# Patient Record
Sex: Female | Born: 1945 | Race: Black or African American | Hispanic: No | Smoking: Current every day smoker
Health system: Southern US, Community
[De-identification: ages and names within clinical notes are randomized; demographics above are authoritative.]

## PROBLEM LIST (undated history)

## (undated) DIAGNOSIS — G4700 Insomnia, unspecified: Secondary | ICD-10-CM

## (undated) DIAGNOSIS — R232 Flushing: Secondary | ICD-10-CM

## (undated) DIAGNOSIS — I7090 Unspecified atherosclerosis: Secondary | ICD-10-CM

## (undated) DIAGNOSIS — Z87898 Personal history of other specified conditions: Secondary | ICD-10-CM

## (undated) DIAGNOSIS — N898 Other specified noninflammatory disorders of vagina: Secondary | ICD-10-CM

## (undated) DIAGNOSIS — R911 Solitary pulmonary nodule: Secondary | ICD-10-CM

## (undated) DIAGNOSIS — D649 Anemia, unspecified: Secondary | ICD-10-CM

## (undated) DIAGNOSIS — K219 Gastro-esophageal reflux disease without esophagitis: Secondary | ICD-10-CM

## (undated) DIAGNOSIS — J189 Pneumonia, unspecified organism: Secondary | ICD-10-CM

## (undated) DIAGNOSIS — Z720 Tobacco use: Secondary | ICD-10-CM

## (undated) DIAGNOSIS — Z8739 Personal history of other diseases of the musculoskeletal system and connective tissue: Secondary | ICD-10-CM

## (undated) DIAGNOSIS — F17200 Nicotine dependence, unspecified, uncomplicated: Secondary | ICD-10-CM

## (undated) DIAGNOSIS — E7800 Pure hypercholesterolemia, unspecified: Secondary | ICD-10-CM

## (undated) DIAGNOSIS — J449 Chronic obstructive pulmonary disease, unspecified: Secondary | ICD-10-CM

## (undated) DIAGNOSIS — I10 Essential (primary) hypertension: Secondary | ICD-10-CM

## (undated) DIAGNOSIS — K862 Cyst of pancreas: Secondary | ICD-10-CM

## (undated) DIAGNOSIS — M8580 Other specified disorders of bone density and structure, unspecified site: Secondary | ICD-10-CM

## (undated) DIAGNOSIS — IMO0002 Reserved for concepts with insufficient information to code with codable children: Secondary | ICD-10-CM

## (undated) DIAGNOSIS — K8020 Calculus of gallbladder without cholecystitis without obstruction: Secondary | ICD-10-CM

## (undated) DIAGNOSIS — I509 Heart failure, unspecified: Secondary | ICD-10-CM

## (undated) DIAGNOSIS — I208 Other forms of angina pectoris: Secondary | ICD-10-CM

## (undated) DIAGNOSIS — I2089 Other forms of angina pectoris: Secondary | ICD-10-CM

## (undated) DIAGNOSIS — I2510 Atherosclerotic heart disease of native coronary artery without angina pectoris: Secondary | ICD-10-CM

## (undated) DIAGNOSIS — I2584 Coronary atherosclerosis due to calcified coronary lesion: Secondary | ICD-10-CM

## (undated) HISTORY — DX: Other specified noninflammatory disorders of vagina: N89.8

## (undated) HISTORY — DX: Insomnia, unspecified: G47.00

## (undated) HISTORY — DX: Nicotine dependence, unspecified, uncomplicated: F17.200

## (undated) HISTORY — DX: Calculus of gallbladder without cholecystitis without obstruction: K80.20

## (undated) HISTORY — DX: Pure hypercholesterolemia, unspecified: E78.00

## (undated) HISTORY — DX: Chronic obstructive pulmonary disease, unspecified: J44.9

## (undated) HISTORY — DX: Other forms of angina pectoris: I20.8

## (undated) HISTORY — DX: Other specified disorders of bone density and structure, unspecified site: M85.80

## (undated) HISTORY — DX: Reserved for concepts with insufficient information to code with codable children: IMO0002

## (undated) HISTORY — DX: Unspecified atherosclerosis: I70.90

## (undated) HISTORY — DX: Heart failure, unspecified: I50.9

## (undated) HISTORY — DX: Anemia, unspecified: D64.9

## (undated) HISTORY — DX: Solitary pulmonary nodule: R91.1

## (undated) HISTORY — DX: Coronary atherosclerosis due to calcified coronary lesion: I25.84

## (undated) HISTORY — DX: Atherosclerotic heart disease of native coronary artery without angina pectoris: I25.10

## (undated) HISTORY — DX: Gastro-esophageal reflux disease without esophagitis: K21.9

## (undated) HISTORY — DX: Cyst of pancreas: K86.2

## (undated) HISTORY — PX: CHOLECYSTECTOMY: SHX55

## (undated) HISTORY — DX: Personal history of other specified conditions: Z87.898

## (undated) HISTORY — DX: Tobacco use: Z72.0

## (undated) HISTORY — DX: Personal history of other diseases of the musculoskeletal system and connective tissue: Z87.39

## (undated) HISTORY — DX: Pneumonia, unspecified organism: J18.9

## (undated) HISTORY — DX: Flushing: R23.2

## (undated) HISTORY — DX: Other forms of angina pectoris: I20.89

## (undated) HISTORY — DX: Essential (primary) hypertension: I10

---

## 2000-06-04 ENCOUNTER — Emergency Department (HOSPITAL_COMMUNITY): Admission: EM | Admit: 2000-06-04 | Discharge: 2000-06-05 | Payer: Self-pay | Admitting: Emergency Medicine

## 2000-06-04 ENCOUNTER — Encounter: Payer: Self-pay | Admitting: Emergency Medicine

## 2005-08-31 ENCOUNTER — Ambulatory Visit: Payer: Self-pay | Admitting: Nurse Practitioner

## 2005-09-01 ENCOUNTER — Ambulatory Visit: Payer: Self-pay | Admitting: *Deleted

## 2005-09-03 ENCOUNTER — Ambulatory Visit (HOSPITAL_COMMUNITY): Admission: RE | Admit: 2005-09-03 | Discharge: 2005-09-03 | Payer: Self-pay | Admitting: Family Medicine

## 2005-09-29 ENCOUNTER — Ambulatory Visit (HOSPITAL_COMMUNITY): Admission: RE | Admit: 2005-09-29 | Discharge: 2005-09-29 | Payer: Self-pay | Admitting: Internal Medicine

## 2005-10-22 ENCOUNTER — Ambulatory Visit: Payer: Self-pay | Admitting: Nurse Practitioner

## 2006-04-13 ENCOUNTER — Ambulatory Visit: Payer: Self-pay | Admitting: Nurse Practitioner

## 2006-05-10 ENCOUNTER — Emergency Department (HOSPITAL_COMMUNITY): Admission: EM | Admit: 2006-05-10 | Discharge: 2006-05-10 | Payer: Self-pay | Admitting: Emergency Medicine

## 2006-05-11 ENCOUNTER — Ambulatory Visit: Payer: Self-pay | Admitting: Nurse Practitioner

## 2006-06-02 ENCOUNTER — Ambulatory Visit (HOSPITAL_COMMUNITY): Admission: RE | Admit: 2006-06-02 | Discharge: 2006-06-02 | Payer: Self-pay | Admitting: Nurse Practitioner

## 2006-06-02 ENCOUNTER — Ambulatory Visit: Payer: Self-pay | Admitting: Nurse Practitioner

## 2006-09-28 ENCOUNTER — Encounter (INDEPENDENT_AMBULATORY_CARE_PROVIDER_SITE_OTHER): Payer: Self-pay | Admitting: *Deleted

## 2006-09-28 ENCOUNTER — Encounter
Admission: RE | Admit: 2006-09-28 | Discharge: 2006-09-28 | Payer: Self-pay | Admitting: Physical Medicine and Rehabilitation

## 2006-10-03 ENCOUNTER — Ambulatory Visit (HOSPITAL_COMMUNITY): Admission: RE | Admit: 2006-10-03 | Discharge: 2006-10-03 | Payer: Self-pay | Admitting: Family Medicine

## 2006-11-27 ENCOUNTER — Emergency Department (HOSPITAL_COMMUNITY): Admission: EM | Admit: 2006-11-27 | Discharge: 2006-11-28 | Payer: Self-pay | Admitting: Emergency Medicine

## 2006-12-28 ENCOUNTER — Encounter: Admission: RE | Admit: 2006-12-28 | Discharge: 2006-12-28 | Payer: Self-pay | Admitting: Gastroenterology

## 2007-01-02 ENCOUNTER — Emergency Department (HOSPITAL_COMMUNITY): Admission: EM | Admit: 2007-01-02 | Discharge: 2007-01-02 | Payer: Self-pay | Admitting: Emergency Medicine

## 2007-01-08 ENCOUNTER — Encounter: Admission: RE | Admit: 2007-01-08 | Discharge: 2007-01-08 | Payer: Self-pay | Admitting: General Surgery

## 2007-03-09 ENCOUNTER — Ambulatory Visit (HOSPITAL_COMMUNITY): Admission: RE | Admit: 2007-03-09 | Discharge: 2007-03-10 | Payer: Self-pay | Admitting: General Surgery

## 2007-03-09 ENCOUNTER — Encounter (INDEPENDENT_AMBULATORY_CARE_PROVIDER_SITE_OTHER): Payer: Self-pay | Admitting: General Surgery

## 2007-10-04 ENCOUNTER — Ambulatory Visit (HOSPITAL_COMMUNITY): Admission: RE | Admit: 2007-10-04 | Discharge: 2007-10-04 | Payer: Self-pay | Admitting: Family Medicine

## 2008-08-15 ENCOUNTER — Encounter: Admission: RE | Admit: 2008-08-15 | Discharge: 2008-08-15 | Payer: Self-pay | Admitting: Family Medicine

## 2008-08-22 ENCOUNTER — Ambulatory Visit (HOSPITAL_COMMUNITY): Admission: RE | Admit: 2008-08-22 | Discharge: 2008-08-22 | Payer: Self-pay | Admitting: Family Medicine

## 2008-10-04 ENCOUNTER — Ambulatory Visit (HOSPITAL_COMMUNITY): Admission: RE | Admit: 2008-10-04 | Discharge: 2008-10-04 | Payer: Self-pay | Admitting: Family Medicine

## 2008-10-15 ENCOUNTER — Other Ambulatory Visit: Admission: RE | Admit: 2008-10-15 | Discharge: 2008-10-15 | Payer: Self-pay | Admitting: Family Medicine

## 2010-02-01 ENCOUNTER — Encounter: Payer: Self-pay | Admitting: Family Medicine

## 2010-02-23 ENCOUNTER — Ambulatory Visit (INDEPENDENT_AMBULATORY_CARE_PROVIDER_SITE_OTHER): Payer: Medicare Other | Admitting: Cardiovascular Disease

## 2010-02-23 DIAGNOSIS — R079 Chest pain, unspecified: Secondary | ICD-10-CM

## 2010-02-23 DIAGNOSIS — E7800 Pure hypercholesterolemia, unspecified: Secondary | ICD-10-CM

## 2010-03-06 ENCOUNTER — Telehealth (INDEPENDENT_AMBULATORY_CARE_PROVIDER_SITE_OTHER): Payer: Self-pay | Admitting: *Deleted

## 2010-03-09 ENCOUNTER — Encounter: Payer: Self-pay | Admitting: Internal Medicine

## 2010-03-09 ENCOUNTER — Ambulatory Visit (HOSPITAL_COMMUNITY): Payer: Medicare Other | Attending: Cardiovascular Disease

## 2010-03-09 DIAGNOSIS — R079 Chest pain, unspecified: Secondary | ICD-10-CM | POA: Insufficient documentation

## 2010-03-09 DIAGNOSIS — R0602 Shortness of breath: Secondary | ICD-10-CM

## 2010-03-09 DIAGNOSIS — R0789 Other chest pain: Secondary | ICD-10-CM

## 2010-03-10 NOTE — Progress Notes (Signed)
Summary: Nuclear Pre-Procedure  Phone Note Outgoing Call Call back at [facility] Phone [phone]   Call placed by: Stanton Kidney, EMT-P,  March 06, 2010 1:04 PM Call placed to: Patient Action Taken: Phone Call Completed Summary of Call: Reviewed information on Myoview Information Sheet (see scanned document for further details).  Spoke with the patient. Stanton Kidney, EMT-P  March 06, 2010 1:04 PM      Nuclear Med Background Indications for Stress Test: Evaluation for Ischemia   History: Myocardial Perfusion Study  History Comments: '00 MPS: NL  Symptoms: Chest Pain    Nuclear Pre-Procedure Cardiac Risk Factors: Lipids, Smoker

## 2010-03-19 NOTE — Assessment & Plan Note (Signed)
Summary: Cardiology Nuclear Testing  Nuclear Med Background Indications for Stress Test: Evaluation for Ischemia   History: Myocardial Perfusion Study  History Comments: '00 MPS: NL  Symptoms: Chest Pain, Chest Tightness, Diaphoresis, Fatigue, Light-Headedness, Nausea, Palpitations  Symptoms Comments: Last episode of CP and tightness was 2wks ago.  Pain radiates to L side/neck.   Nuclear Pre-Procedure Cardiac Risk Factors: Family History - CAD, Lipids, Smoker Caffeine/Decaff Intake: None NPO After: 6:00 PM Lungs: clear IV 0.9% NS with Angio Cath: 18g     IV Site: R Antecubital IV Started by: Stanton Kidney, EMT-P Chest Size (in) 40     Cup Size B     Height (in): 61.5 Weight (lb): 147 BMI: 27.42  Nuclear Med Study 1 or 2 day study:  1 day     Stress Test Type:  Eugenie Birks Reading MD:  Dietrich Pates, MD     Referring MD:  Gildardo Cranker Resting Radionuclide:  Technetium 34m Tetrofosmin     Resting Radionuclide Dose:  11.0 mCi  Stress Radionuclide:  Technetium 5m Tetrofosmin     Stress Radionuclide Dose:  33.0 mCi   Stress Protocol      Max HR:  83 bpm     Predicted Max HR:  154 bpm  Max Systolic BP: 131 mm Hg     Percent Max HR:  53.90 %Rate Pressure Product:  73220  Lexiscan: 0.4 mg   Stress Test Technologist:  Cathlyn Parsons, RN     Nuclear Technologist:  Domenic Polite, CNMT  Rest Procedure  Myocardial perfusion imaging was performed at rest 45 minutes following the intravenous administration of Technetium 39m Tetrofosmin.  Stress Procedure  The patient received IV Lexiscan 0.4 mg over 15-seconds.  Technetium 43m Tetrofosmin injected at 30-seconds.  There were no significant changes with infusion. Patient had chest tightness 6/10 with infusion.  Relilieved in recovery. Patient had occas PAC's.  Quantitative spect images were obtained after a 45 minute delay.  QPS Raw Data Images:  Images were motion corrected.  SOft tissue (diaphragm, bowel activity, breast tissue)  surround heart. Stress Images:  Thinning in the distal anterior and apical walls.  Otherwise normal perfusion. Rest Images:  Incomplete improvement from the stress images. Subtraction (SDS):  No significant ischemia Transient Ischemic Dilatation:  1.05  (Normal <1.22)  Lung/Heart Ratio:  0.28  (Normal <0.45)  Quantitative Gated Spect Images QGS EDV:  60 ml QGS ESV:  16 ml QGS EF:  73 % QGS cine images:  Normal wall motion.   Overall Impression  Exercise Capacity: Lexiscan with no exercise. BP Response: Normal blood pressure response. Clinical Symptoms: Moderate chest tightness, eased after infusion. ECG Impression: No significant ST segment change suggestive of ischemia. Overall Impression: Probable normal perfusion and soft tissue attenuation (breast).  No significant ischemia or scar.  Appended Document: Cardiology Nuclear Testing copy sent to Dr. Melburn Popper

## 2010-05-26 NOTE — Op Note (Signed)
NAME:  Autumn Mckinney, Autumn Mckinney NO.:  [id number]   MEDICAL RECORD NO.:  [id number]          PATIENT TYPE:  OIB   LOCATION:  5122                         FACILITY:  [facility]   PHYSICIAN:  Ollen Gross. Vernell Morgans, M.D. DATE OF BIRTH:  Oct 09, 1945   DATE OF PROCEDURE:  03/09/2007  DATE OF DISCHARGE:  03/10/2007                               OPERATIVE REPORT   PREOPERATIVE DIAGNOSIS:  Gallstones.   POSTOPERATIVE DIAGNOSIS:  Gallstones.   PROCEDURE:  Laparoscopic cholecystectomy with intraoperative  cholangiogram.   SURGEON:  Ollen Gross. Vernell Morgans, M.D.   ASSISTANT:  Leonie Man, M.D.   ANESTHESIA:  General endotracheal.   PROCEDURE:  After informed consent was obtained, the patient was brought  to the operating room and placed in the supine position on the operating  room table.  After induction of general anesthesia, the patient's  abdomen was prepped with Betadine and draped in the usual sterile  manner.  The area below the umbilicus was infiltrated with 0.25%  Marcaine.  A small incision was made with a 15-blade knife.  This  incision was carried down through the subcutaneous tissue bluntly with a  hemostat and [employer] retractors until the linea alba was identified.  The linea alba was incised with a 15-blade knife and each side was  grasped with Kocher clamps and elevated anteriorly.  The preperitoneal  space was then probed bluntly with a hemostat until the peritoneum was  opened and access was gained to the abdominal cavity.  A 0 Vicryl  pursestring stitch was placed in the fascia around the opening, an  Hasson cannula was placed through the opening and anchored in place with  the previously placed Vicryl pursestring stitch.  The abdomen was then  insufflated with carbon dioxide without difficulty.  The patient was  placed in reverse Trendelenburg position and rotated with the right side  up.  A laparoscope was inserted through the Hasson cannula and the right  upper  quadrant was inspected.  The dome of the gallbladder and liver  were readily identified.  Next the epigastric region was infiltrated  with 0.25% Marcaine.  A small incision was made with a 15- blade knife.  A 10 mm port was then placed bluntly through this incision into the  abdominal cavity under direct vision.  Sites were then chosen laterally  on the right side of the abdomen with the placement of 5 mm ports.  Each  of these areas was infiltrated with 0.25% Marcaine.  Small stab  incisions were made with the 15-blade knife.  Five millimeter ports were  then placed bluntly through these incisions into the abdominal cavity  under direct vision.  A blunt grasper was placed through the lateralmost  5 mm port and used to grasp the dome of the gallbladder and elevated  anteriorly and superiorly.  Another blunt grasper was placed through the  other 5 mm port and used to retract on the body and neck of the  gallbladder.  Dissector was placed through the epigastric port and using  the electrocautery the peritoneal reflection at the gallbladder neck  area was opened.  Blunt dissection was then carried out in this area  until the gallbladder neck/cystic duct junction was readily identified  and a good window was created.  A single clip was placed on the  gallbladder neck.  A small ductotomy was made just below the clip.  A 14-  gauge Angiocath was then placed percutaneously through the anterior  abdominal wall under direct vision.  A Reddick cholangiogram catheter  placed through the Angiocath and flushed.  The Reddick catheter was then  placed within the cystic duct and anchored in place with a clip.  Cholangiogram was obtained that showed no filling defects, good emptying  in the duodenum, and a very short cystic duct.  The anchoring clip and  catheters were removed from the patient.  Two clips were placed proximal  on the cystic duct and the duct was divided between the 2 sets of clips.   Posterior to this the cystic artery was identified and again dissected  bluntly in a circumferential manner until a good window was created.  Two clips were placed proximally and one distally on the artery and the  artery was divided between the two.  Next a laparoscopic hook cautery  device was used to separate the gallbladder from the liver bed.  Prior  to completely detaching the gallbladder from the liver bed, the liver  bed was inspected and several small bleeding points were coagulated with  electrocautery until the area was completely hemostatic.  The  gallbladder was then detached from its source from the liver bed without  difficulty.  A laparoscopic bag was then inserted through the epigastric  port, the gallbladder was placed in the bag and bag was sealed.  The  abdomen was then irrigated with copious amounts of saline until the  effluent was clear.  The laparoscope was then moved to the epigastric  port.  A gallbladder grasper was placed through the Hasson cannula and  used to grasp the open end of the bag.  The bag with the gallbladder was  removed through the infraumbilical port without difficulty.  The fascial  defect was closed with the previously placed Vicryl pursestring stitch  as well as with another figure-of-eight 0 Vicryl stitch.  The rest of  the ports were removed under direct vision and were found to be  hemostatic.  The gas was allowed to escape.  The skin incisions were all  closed with interrupted 4-0 Monocryl subcuticular stitches.  Dermabond  dressings were applied.  The patient tolerated the procedure well.  At  the end of the case all needle, sponge and instrument counts were  correct.  The patient was then awakened and taken to the recovery room  in stable condition.      Ollen Gross. Vernell Morgans, M.D.  Electronically Signed     PST/MEDQ  D:  03/09/2007  T:  03/10/2007  Job:  [id number]

## 2010-10-02 LAB — COMPREHENSIVE METABOLIC PANEL
ALT: 11
Calcium: 9.2
Chloride: 107
Creatinine, Ser: 0.71
GFR calc non Af Amer: >60
Sodium: 138
Total Bilirubin: 0.4
Total Protein: 7.5

## 2010-10-02 LAB — DIFFERENTIAL
Eosinophils Relative: 1
Lymphocytes Relative: 39
Lymphs Abs: 2.3
Monocytes Absolute: 0.5

## 2010-10-02 LAB — CBC
Hemoglobin: 14.3
MCV: 90.4
Platelets: 307
RDW: 13.9

## 2010-10-16 LAB — CBC
HCT: 41.6
Hemoglobin: 14.4
MCHC: 34.6
MCV: 87.6
RBC: 4.75
RDW: 14.8
WBC: 6.7

## 2010-10-16 LAB — DIFFERENTIAL
Basophils Absolute: 0
Basophils Relative: 0
Neutro Abs: 3.9

## 2010-10-16 LAB — COMPREHENSIVE METABOLIC PANEL
AST: 19
Alkaline Phosphatase: 87
BUN: 7
CO2: 24
Calcium: 9.4
Glucose, Bld: 150 — ABNORMAL HIGH
Potassium: 3.8
Sodium: 138
Total Protein: 7.7

## 2010-10-16 LAB — URINALYSIS, ROUTINE W REFLEX MICROSCOPIC
Bilirubin Urine: NEGATIVE
Ketones, ur: NEGATIVE
Nitrite: NEGATIVE
Specific Gravity, Urine: 1.02

## 2010-10-20 LAB — CBC
MCHC: 34.6
MCV: 87.5
Platelets: 274
RDW: 14.5

## 2010-10-20 LAB — COMPREHENSIVE METABOLIC PANEL
AST: 17
Albumin: 3.5
BUN: 6
Calcium: 9
Creatinine, Ser: 0.63
GFR calc Af Amer: >60

## 2010-10-20 LAB — URINALYSIS, ROUTINE W REFLEX MICROSCOPIC
Bilirubin Urine: NEGATIVE
Hgb urine dipstick: NEGATIVE
Ketones, ur: NEGATIVE
Protein, ur: NEGATIVE
Urobilinogen, UA: 0.2

## 2010-10-20 LAB — DIFFERENTIAL
Eosinophils Relative: 2
Lymphocytes Relative: 45
Lymphs Abs: 3.2
Monocytes Absolute: 0.6
Neutro Abs: 3.2

## 2010-10-20 LAB — LIPASE, BLOOD: Lipase: 24

## 2010-10-22 ENCOUNTER — Other Ambulatory Visit (HOSPITAL_COMMUNITY): Payer: Self-pay | Admitting: Family Medicine

## 2010-10-22 DIAGNOSIS — Z1231 Encounter for screening mammogram for malignant neoplasm of breast: Secondary | ICD-10-CM

## 2010-11-05 ENCOUNTER — Ambulatory Visit (HOSPITAL_COMMUNITY)
Admission: RE | Admit: 2010-11-05 | Discharge: 2010-11-05 | Disposition: A | Discharge status: Home / Self Care | Payer: Medicare Other | Source: Ambulatory Visit | Attending: Family Medicine | Admitting: Family Medicine

## 2010-11-05 DIAGNOSIS — Z1231 Encounter for screening mammogram for malignant neoplasm of breast: Secondary | ICD-10-CM | POA: Insufficient documentation

## 2011-02-14 ENCOUNTER — Other Ambulatory Visit: Payer: Self-pay | Admitting: Cardiovascular Disease

## 2011-02-15 NOTE — Telephone Encounter (Signed)
Fax Received. Refill Completed. Autumn Mckinney (R.M.A)  Pt needs appointment then refill can be made  

## 2011-03-10 ENCOUNTER — Other Ambulatory Visit: Payer: Self-pay | Admitting: Cardiovascular Disease

## 2011-03-11 ENCOUNTER — Telehealth: Payer: Self-pay | Admitting: Cardiovascular Disease

## 2011-03-11 MED ORDER — AMLODIPINE BESYLATE 10 MG PO TABS: 10.0000 mg | ORAL_TABLET | Freq: Every day | ORAL | Status: DC

## 2011-03-11 NOTE — Telephone Encounter (Signed)
Called  Pt informed we need to have yearly app, told her we will refill for 60  Days, pt agreed to plan.

## 2011-03-11 NOTE — Telephone Encounter (Signed)
   Patient would like a return call concerning meds.  She attempted to get a refill on amlodipine med and was told she needed to see the doctor.  Patient said she will not be able to come in as she is on the way out of town to take care of daughter who is having surgery.  Please call her at [phone] to advise

## 2011-05-17 ENCOUNTER — Other Ambulatory Visit: Payer: Self-pay | Admitting: Cardiovascular Disease

## 2012-06-26 ENCOUNTER — Other Ambulatory Visit (HOSPITAL_COMMUNITY): Payer: Self-pay | Admitting: *Deleted

## 2012-06-26 DIAGNOSIS — Z1231 Encounter for screening mammogram for malignant neoplasm of breast: Secondary | ICD-10-CM

## 2012-07-06 ENCOUNTER — Ambulatory Visit (HOSPITAL_COMMUNITY)
Admission: RE | Admit: 2012-07-06 | Discharge: 2012-07-06 | Disposition: A | Discharge status: Home / Self Care | Payer: Medicare Other | Source: Ambulatory Visit | Attending: *Deleted | Admitting: *Deleted

## 2012-07-06 DIAGNOSIS — Z1231 Encounter for screening mammogram for malignant neoplasm of breast: Secondary | ICD-10-CM | POA: Insufficient documentation

## 2013-05-28 ENCOUNTER — Other Ambulatory Visit (HOSPITAL_COMMUNITY): Payer: Self-pay | Admitting: Internal Medicine

## 2013-05-28 DIAGNOSIS — Z1231 Encounter for screening mammogram for malignant neoplasm of breast: Secondary | ICD-10-CM

## 2013-07-10 ENCOUNTER — Ambulatory Visit (HOSPITAL_COMMUNITY)
Admission: RE | Admit: 2013-07-10 | Discharge: 2013-07-10 | Disposition: A | Discharge status: Home / Self Care | Payer: Medicare Other | Source: Ambulatory Visit | Attending: Internal Medicine | Admitting: Internal Medicine

## 2013-07-10 DIAGNOSIS — Z1231 Encounter for screening mammogram for malignant neoplasm of breast: Secondary | ICD-10-CM | POA: Insufficient documentation

## 2014-06-03 ENCOUNTER — Other Ambulatory Visit (HOSPITAL_COMMUNITY): Payer: Self-pay | Admitting: Internal Medicine

## 2014-06-03 DIAGNOSIS — Z1231 Encounter for screening mammogram for malignant neoplasm of breast: Secondary | ICD-10-CM

## 2014-07-16 ENCOUNTER — Ambulatory Visit (HOSPITAL_COMMUNITY)
Admission: RE | Admit: 2014-07-16 | Discharge: 2014-07-16 | Disposition: A | Discharge status: Home / Self Care | Payer: Commercial Managed Care - HMO | Source: Ambulatory Visit | Attending: Internal Medicine | Admitting: Internal Medicine

## 2014-07-16 DIAGNOSIS — Z1231 Encounter for screening mammogram for malignant neoplasm of breast: Secondary | ICD-10-CM | POA: Insufficient documentation

## 2014-11-14 ENCOUNTER — Other Ambulatory Visit: Payer: Self-pay | Admitting: Acute Care

## 2014-11-14 DIAGNOSIS — F17210 Nicotine dependence, cigarettes, uncomplicated: Principal | ICD-10-CM

## 2014-11-15 ENCOUNTER — Encounter: Payer: Self-pay | Admitting: Acute Care

## 2014-11-15 ENCOUNTER — Ambulatory Visit
Admission: RE | Admit: 2014-11-15 | Discharge: 2014-11-15 | Disposition: A | Discharge status: Home / Self Care | Payer: Commercial Managed Care - HMO | Source: Ambulatory Visit | Attending: Acute Care | Admitting: Acute Care

## 2014-11-15 ENCOUNTER — Telehealth: Payer: Self-pay | Admitting: Acute Care

## 2014-11-15 ENCOUNTER — Ambulatory Visit (INDEPENDENT_AMBULATORY_CARE_PROVIDER_SITE_OTHER): Payer: Commercial Managed Care - HMO | Admitting: Acute Care

## 2014-11-15 DIAGNOSIS — F17210 Nicotine dependence, cigarettes, uncomplicated: Principal | ICD-10-CM

## 2014-11-15 DIAGNOSIS — F17200 Nicotine dependence, unspecified, uncomplicated: Secondary | ICD-10-CM | POA: Diagnosis not present

## 2014-11-15 HISTORY — DX: Nicotine dependence, unspecified, uncomplicated: F17.200

## 2014-11-15 NOTE — Telephone Encounter (Signed)
I called to give results of the low dose CT to Dr. Ardeth Perfect. He is not in the office, but I spoke with the receptionist, who put me into the voice mail of Amy. I did ask if it was the policy of the practice to put providers calling clinical findings into a voice mail, and she told me yes. I have left a detailed message regarding the incidental finding of a pancreatic cyst, which the patient said she had biopsied at [facility] in 2010, which resulted in a benign result.I have also faxed the entire CT result to Dr. Hoover Brunette clinical fax at [phone], and asked him to call me with any questions or concerns.

## 2014-11-15 NOTE — Telephone Encounter (Signed)
I called the results of Autumn Mckinney LDCT to her. I explained that the ct was read as a  Lung RADs 2, nodules with a very low likelihood of becoming a clinically active cancer due to lack of size or growth. I told her we will call and schedule her for her repeat scan in 12 months , about mid October of 2017. She verbalized understanding. I also spoke with her about the incidental finding of a pancreatic cyst, which she told me was found in 2009, and biopsied in 2010.She said it was benign. I have called Dr. Chinita Greenland office with this result and also I have faxed the results to him on the clinical fax line.

## 2014-11-15 NOTE — Progress Notes (Signed)
Shared Decision Making Visit Lung Cancer Screening Program [phone])   Eligibility:  Age 69 y.o.  Pack Years Smoking History Calculation:70+pack years (# packs/per year x # years smoked)  Recent History of coughing up blood  no  Unexplained weight loss? no ( >Than 15 pounds within the last 6 months )  Prior History Lung / other cancer no (Diagnosis within the last 5 years already requiring surveillance chest CT Scans).  Smoking Status Current Smoker  Former Smokers: Years since quit:   Quit Date: NA  Visit Components:  Discussion included one or more decision making aids. yes  Discussion included risk/benefits of screening. yes  Discussion included potential follow up diagnostic testing for abnormal scans. yes  Discussion included meaning and risk of over diagnosis. yes  Discussion included meaning and risk of False Positives. yes  Discussion included meaning of total radiation exposure. yes  Counseling Included:  Importance of adherence to annual lung cancer LDCT screening. yes  Impact of comorbidities on ability to participate in the program. yes  Ability and willingness to under diagnostic treatment. yes  Smoking Cessation Counseling:  Current Smokers:   Discussed importance of smoking cessation. yes  Information about tobacco cessation classes and interventions provided to patient. yes  Patient provided with "ticket" for LDCT Scan. yes  Symptomatic Patient. no  Counseling  Diagnosis Code: Tobacco Use Z72.0  Asymptomatic Patient yes  Counseling (Intermediate counseling: > three minutes counseling) W1093  Former Smokers:   Discussed the importance of maintaining cigarette abstinence.NA; current smoker  Diagnosis Code: Personal History of Nicotine Dependence. A35.573  Information about tobacco cessation classes and interventions provided to patient. Yes  Patient provided with "ticket" for LDCT Scan. yes  Written Order for Lung Cancer Screening  with LDCT placed in Epic. Yes (CT Chest Lung Cancer Screening Low Dose W/O CM) [id number] Z12.2-Screening of respiratory organs Z87.891-Personal history of nicotine dependence   I spent 20 minutes of face to face time with Ms. Harrington discussing the risks and benefits of lung cancer screening. We viewed a power point together that addressed the above noted issues, pausing at intervals to allow for questions to be asked and answered to ensure understanding. We discussed that the single most powerful action that she can take to decrease her risk of developing lung cancer is to quit smoking. She is not ready to set a quit date. We discussed trying to set small achievable goals, like decreasing the number of cigarettes daily over time. I gave her the be stronger than your excuses card and we reviewed the community resources and their contact information on the back of the card. I pointed out the [employer] number and that she can get free nicotine replacement therapy by reaching out to them. We discussed the free Quit Smart classes offered by both [place] [facility] And how to sign up for them. We also discussed the use of medications such as Chantix and Wellbutrin, and behavior modification. I have given her my card and contact information and asked her to call either me or Dr. Ardeth Perfect when she is ready to quit so that we can make sure she has the tools to successfully quit smoking. We discussed the time and location of her scan and that I will call her with her results within 24-48 hours of receiving them I did tell her that since today is Friday, it will most likely be Monday that I will call her the results.I gave her a copy of  the power point we viewed together to refer to in the future. She verbalized understanding of all of the above and had no further questions upon leaving the office.  Magdalen Spatz, NP

## 2015-04-02 ENCOUNTER — Other Ambulatory Visit: Payer: Self-pay | Admitting: Acute Care

## 2015-04-02 DIAGNOSIS — F17210 Nicotine dependence, cigarettes, uncomplicated: Principal | ICD-10-CM

## 2015-11-17 ENCOUNTER — Ambulatory Visit
Admission: RE | Admit: 2015-11-17 | Discharge: 2015-11-17 | Disposition: A | Discharge status: Home / Self Care | Payer: Commercial Managed Care - HMO | Source: Ambulatory Visit | Attending: Acute Care | Admitting: Acute Care

## 2015-11-17 ENCOUNTER — Telehealth: Payer: Self-pay | Admitting: Acute Care

## 2015-11-17 DIAGNOSIS — F17210 Nicotine dependence, cigarettes, uncomplicated: Principal | ICD-10-CM

## 2015-11-17 NOTE — Telephone Encounter (Signed)
These results have been called to the patient. I explained that her scan was read as a  Lung RADS 2: nodules that are benign in appearance and behavior with a very low likelihood of becoming a clinically active cancer due to size or lack of growth. Recommendation per radiology is for a repeat LDCT in 12 months.We will order and schedule her annual scan for 11/2016.We did discuss the incidental findings of aortic atherosclerosis and coronary artery calcification.The patient states that she is not currently on a cholesterol medication, but she does have her cholesterol checked on an annual basis by her PCP Dr. Velna Hatchet. She has an appointment with him on 11/ 27, and I have told her to discuss this with him then. I explained that this is a non-gated exam, and cannot determine degree or severity of disease.She verbalized understanding of the above and had no further questions upon completion of the call. She has my contact information in the event she needs to contact me in the future. I will fax a copy of the exam to the PCP for completeness of her medical record.

## 2015-12-17 ENCOUNTER — Other Ambulatory Visit: Payer: Self-pay | Admitting: Internal Medicine

## 2015-12-17 DIAGNOSIS — R748 Abnormal levels of other serum enzymes: Secondary | ICD-10-CM

## 2015-12-17 DIAGNOSIS — K869 Disease of pancreas, unspecified: Secondary | ICD-10-CM

## 2015-12-25 ENCOUNTER — Other Ambulatory Visit: Payer: Commercial Managed Care - HMO

## 2015-12-29 ENCOUNTER — Ambulatory Visit
Admission: RE | Admit: 2015-12-29 | Discharge: 2015-12-29 | Disposition: A | Discharge status: Home / Self Care | Payer: Medicare Other | Source: Ambulatory Visit | Attending: Internal Medicine | Admitting: Internal Medicine

## 2015-12-29 DIAGNOSIS — R748 Abnormal levels of other serum enzymes: Secondary | ICD-10-CM

## 2015-12-29 DIAGNOSIS — K869 Disease of pancreas, unspecified: Secondary | ICD-10-CM

## 2015-12-29 MED ORDER — IOPAMIDOL (ISOVUE-300) INJECTION 61%
100.0000 mL | Freq: Once | INTRAVENOUS | Status: AC | PRN
  Administered 2015-12-29: 100 mL via INTRAVENOUS

## 2016-11-01 ENCOUNTER — Other Ambulatory Visit: Payer: Self-pay | Admitting: Acute Care

## 2016-11-01 DIAGNOSIS — F17210 Nicotine dependence, cigarettes, uncomplicated: Principal | ICD-10-CM

## 2016-11-01 DIAGNOSIS — Z122 Encounter for screening for malignant neoplasm of respiratory organs: Secondary | ICD-10-CM

## 2016-11-17 ENCOUNTER — Ambulatory Visit
Admission: RE | Admit: 2016-11-17 | Discharge: 2016-11-17 | Disposition: A | Discharge status: Home / Self Care | Payer: Medicare Other | Source: Ambulatory Visit | Attending: Acute Care | Admitting: Acute Care

## 2016-11-17 DIAGNOSIS — F17210 Nicotine dependence, cigarettes, uncomplicated: Secondary | ICD-10-CM

## 2016-11-17 DIAGNOSIS — Z122 Encounter for screening for malignant neoplasm of respiratory organs: Secondary | ICD-10-CM

## 2016-11-29 ENCOUNTER — Other Ambulatory Visit: Payer: Self-pay | Admitting: Acute Care

## 2016-11-29 DIAGNOSIS — Z122 Encounter for screening for malignant neoplasm of respiratory organs: Secondary | ICD-10-CM

## 2016-11-29 DIAGNOSIS — F17210 Nicotine dependence, cigarettes, uncomplicated: Principal | ICD-10-CM

## 2016-12-17 ENCOUNTER — Telehealth: Payer: Self-pay

## 2016-12-17 NOTE — Telephone Encounter (Signed)
Sent notes to scheduling 

## 2017-01-21 ENCOUNTER — Ambulatory Visit (INDEPENDENT_AMBULATORY_CARE_PROVIDER_SITE_OTHER): Payer: Medicare Other | Admitting: Internal Medicine

## 2017-01-21 ENCOUNTER — Encounter: Payer: Self-pay | Admitting: Internal Medicine

## 2017-01-21 ENCOUNTER — Encounter (INDEPENDENT_AMBULATORY_CARE_PROVIDER_SITE_OTHER): Payer: Self-pay

## 2017-01-21 VITALS — BP 144/82 | HR 70 | Ht 61.5 in | Wt 157.0 lb

## 2017-01-21 DIAGNOSIS — E782 Mixed hyperlipidemia: Secondary | ICD-10-CM | POA: Diagnosis not present

## 2017-01-21 DIAGNOSIS — I10 Essential (primary) hypertension: Secondary | ICD-10-CM | POA: Diagnosis not present

## 2017-01-21 DIAGNOSIS — E785 Hyperlipidemia, unspecified: Secondary | ICD-10-CM | POA: Diagnosis not present

## 2017-01-21 DIAGNOSIS — I25119 Atherosclerotic heart disease of native coronary artery with unspecified angina pectoris: Secondary | ICD-10-CM

## 2017-01-21 MED ORDER — ROSUVASTATIN CALCIUM 10 MG PO TABS: 10.0000 mg | ORAL_TABLET | Freq: Every day | ORAL | 3 refills | Status: DC

## 2017-01-21 NOTE — Progress Notes (Addendum)
Cardiology Office Note   Date:  01/21/2017   ID:  Autumn Mckinney, DOB Mar 12, 1945, MRN [id number]  PCP:  Velna Hatchet, MD  Cardiologist:   Dorris Carnes, MD   Pt referred by Sherin Quarry for     History of Present Illness: Autumn Mckinney is a 72 y.o. female with a history of chest pain, fatigue  I saw her in 2012  Myovue after was normal   Hx of HTN, aortic atherosclerosis, GERD,   LDL in Dec 95  HDL 36    Pt was referred by Dr Ardeth Perfect for CP    Pt has had 1 to 2 episodes of CP with dyspnea at rest per month  PT complains of tightness.  Not associated with activity  ALos complains of sharp cramp under L breast then back  Again, not associated with activity    Not too active  Gets tired  SOB of breath all the time    Current Meds  Medication Sig  . amLODipine (NORVASC) 10 MG tablet Take 1 tablet (10 mg total) by mouth daily.  Marland Kitchen aspirin EC 81 MG tablet Take 81 mg by mouth daily.  Marland Kitchen CALCIUM-VITAMIN D PO Take by mouth daily.  . celecoxib (CELEBREX) 200 MG capsule Take 200 mg by mouth daily.  . Chlorpheniramine Maleate (ALLERGY RELIEF PO) Take by mouth daily.  Marland Kitchen losartan (COZAAR) 50 MG tablet Take 50 mg by mouth daily.  . nitroGLYCERIN (NITROSTAT) 0.4 MG SL tablet Place 0.4 mg under the tongue every 5 (five) minutes as needed for chest pain.  Marland Kitchen omeprazole (PRILOSEC) 40 MG capsule Take 40 mg by mouth daily.  Marland Kitchen topiramate (TOPAMAX) 50 MG tablet Take 50 mg by mouth 2 (two) times daily.  Marland Kitchen zolpidem (AMBIEN) 10 MG tablet Take 10 mg by mouth at bedtime as needed for sleep.     Allergies:   Vicodin [hydrocodone-acetaminophen]   Past Medical History:  Diagnosis Date  . Anemia   . Angina at rest [facility])   . Atherosclerosis   . CHF (congestive heart failure) ([place])   . COPD (chronic obstructive pulmonary disease) ([place])   . Coronary artery calcification   . Current every day smoker 11/15/2014   Counseled on quitting   . Esophageal reflux   . Gallstones   . GERD (gastroesophageal  reflux disease)   . Herniated disc   . History of chest pain   . History of herniated intervertebral disc   . Hot flashes   . Hypercholesterolemia   . Hypertension   . Insomnia   . Lung nodule   . Osteopenia   . Pancreatic cyst   . PNA (pneumonia)   . Tobacco use   . Vaginal dryness     Past Surgical History:  Procedure Laterality Date  . CHOLECYSTECTOMY       Social History:  The patient  reports that she has been smoking cigarettes.  She has a 55.00 pack-year smoking history. she has never used smokeless tobacco. She reports that she does not drink alcohol.   Family History:  The patient's family history includes Arthritis in her sister; CVA in her brother and mother; Cancer in her sister; Diabetes in her mother and sister; Heart attack in her father; Lung cancer in her brother; Prostate cancer in her brother; Stroke in her mother.    ROS:  Please see the history of present illness. All other systems are reviewed and  Negative to the above problem except as noted.  PHYSICAL EXAM: VS:  BP (!) 144/82   Pulse 70   Ht 5' 1.5" (1.562 m)   Wt 157 lb (71.2 kg)   BMI 29.18 kg/m   GEN: Well nourished, well developed, in no acute distress  HEENT: normal  Neck: no JVD, carotid bruits, or masses Cardiac: RRR; no murmurs, rubs, or gallops,no edema  Respiratory:  clear to auscultation bilaterally, normal work of breathing GI: soft, nontender, nondistended, + BS  No hepatomegaly  MS: no deformity Moving all extremities   Skin: warm and dry, no rash Neuro:  Strength and sensation are intact Psych: euthymic mood, full affect   EKG:  EKG is ordered today.  SR 70 bpm     Lipid Panel No results found for: CHOL, TRIG, HDL, CHOLHDL, VLDL, LDLCALC, LDLDIRECT    Wt Readings from Last 3 Encounters:  01/21/17 157 lb (71.2 kg)  03/09/10 147 lb (66.7 kg)      ASSESSMENT AND PLAN:  Pt is a 72 yo with history of SOB and chest tightness   CT showed coronary artery disease I am  not convinced all of her symptoms are cardiac  She does have COPD I would recomm a coronary CT angiogram to evaluate coronary arteries further    2  HL  LDL 95  With CAD needs to be lower  Would set up for Crestor 10 mg  F/U lipids in 8 wks   3  HTN  Follow BP  COntinue meds      Signed, Dorris Carnes, MD  01/21/2017 2:54 PM    [address], [place], [place]  [id number]   ADDENDUM: PT underwent coronary CT angiogram  Calcium scor 845 LM 50 to 75% ostial; LAD 50 -75% ostial   50 to 75% mid LCxLess than 50%  RCA:  50% prox  PLSA 50 to 75%  FFR showed LM normal  RCA prox normal RCA 0.84  PLSA 0.73 normal   LAD 0.88 and distal LAD 0.83  Based on findings would recomm L heart cath tod define anatomy  Risks / benefits described  Pt understands and agrees to proceed.  Dorris Carnes   Pt Phone: [phone]; Fax: [phone]

## 2017-01-21 NOTE — Patient Instructions (Signed)
Your physician recommends that you continue on your current medications as directed. Please refer to the Current Medication list given to you today.  A coronary CT angiogram has been ordered today. The office will contact you to schedule this procedure.  At that time you will be sent an instruction letter.

## 2017-01-27 ENCOUNTER — Other Ambulatory Visit: Payer: Self-pay | Admitting: *Deleted

## 2017-01-27 DIAGNOSIS — Z01812 Encounter for preprocedural laboratory examination: Secondary | ICD-10-CM

## 2017-02-07 ENCOUNTER — Other Ambulatory Visit: Payer: Medicare Other | Admitting: *Deleted

## 2017-02-07 DIAGNOSIS — Z01812 Encounter for preprocedural laboratory examination: Secondary | ICD-10-CM

## 2017-02-08 LAB — BASIC METABOLIC PANEL
BUN/Creatinine Ratio: 14 (ref 12–28)
BUN: 14 mg/dL (ref 8–27)
CO2: 19 mmol/L — AB (ref 20–29)
CREATININE: 0.99 mg/dL (ref 0.57–1.00)
Calcium: 9.3 mg/dL (ref 8.7–10.3)
Chloride: 106 mmol/L (ref 96–106)
GFR calc Af Amer: 65 mL/min/{1.73_m2} (ref >59)
GFR calc non Af Amer: 57 mL/min/{1.73_m2} — ABNORMAL LOW (ref >59)
GLUCOSE: 97 mg/dL (ref 65–99)
Potassium: 4.5 mmol/L (ref 3.5–5.2)
Sodium: 141 mmol/L (ref 134–144)

## 2017-02-09 ENCOUNTER — Encounter: Payer: Self-pay | Admitting: *Deleted

## 2017-02-16 ENCOUNTER — Ambulatory Visit (HOSPITAL_COMMUNITY)
Admission: RE | Admit: 2017-02-16 | Discharge: 2017-02-16 | Disposition: A | Discharge status: Home / Self Care | Payer: Medicare Other | Source: Ambulatory Visit | Attending: Internal Medicine | Admitting: Internal Medicine

## 2017-02-16 DIAGNOSIS — J438 Other emphysema: Secondary | ICD-10-CM | POA: Diagnosis not present

## 2017-02-16 DIAGNOSIS — I25119 Atherosclerotic heart disease of native coronary artery with unspecified angina pectoris: Secondary | ICD-10-CM | POA: Diagnosis not present

## 2017-02-16 DIAGNOSIS — I700 Atherosclerosis of aorta: Secondary | ICD-10-CM | POA: Insufficient documentation

## 2017-02-16 DIAGNOSIS — J432 Centrilobular emphysema: Secondary | ICD-10-CM | POA: Diagnosis not present

## 2017-02-16 MED ORDER — NITROGLYCERIN 0.4 MG SL SUBL
SUBLINGUAL_TABLET | SUBLINGUAL | Status: AC
  Filled 2017-02-16: qty 2

## 2017-02-16 MED ORDER — METOPROLOL TARTRATE 5 MG/5ML IV SOLN: 5.0000 mg | INTRAVENOUS | Status: DC | PRN

## 2017-02-16 MED ORDER — METOPROLOL TARTRATE 5 MG/5ML IV SOLN
INTRAVENOUS | Status: AC
  Filled 2017-02-16: qty 10

## 2017-02-16 MED ORDER — NITROGLYCERIN 0.4 MG SL SUBL
0.8000 mg | SUBLINGUAL_TABLET | Freq: Once | SUBLINGUAL | Status: AC
  Administered 2017-02-16: 0.8 mg via SUBLINGUAL

## 2017-02-16 MED ORDER — IOPAMIDOL (ISOVUE-370) INJECTION 76%
INTRAVENOUS | Status: AC
  Administered 2017-02-16: 100 mL
  Filled 2017-02-16: qty 100

## 2017-02-16 MED ORDER — METOPROLOL TARTRATE 5 MG/5ML IV SOLN
5.0000 mg | Freq: Once | INTRAVENOUS | Status: AC
  Administered 2017-02-16: 5 mg via INTRAVENOUS

## 2017-02-22 ENCOUNTER — Telehealth: Payer: Self-pay | Admitting: *Deleted

## 2017-02-22 DIAGNOSIS — Z01812 Encounter for preprocedural laboratory examination: Secondary | ICD-10-CM

## 2017-02-22 NOTE — Telephone Encounter (Signed)
-----   Message from Fay Records, MD sent at 02/18/2017  5:17 PM EST ----- Called pt to review results of CT Would recomm L heart cath   Risks/benefits explained  Pt understands and agrees to proceed Will need precath labs

## 2017-02-22 NOTE — Telephone Encounter (Signed)
Spoke to patient re: setting up left heart cath. Called cath lab [facility]) and arranged for 02/25/17 at 1:30 pm wit Dr. Irish Lack.  Pt will come in Thursday morning for blood work.  She will pick up instruction letter at that time.

## 2017-02-23 ENCOUNTER — Encounter: Payer: Self-pay | Admitting: *Deleted

## 2017-02-23 NOTE — Telephone Encounter (Signed)
Lab orders placed and appointment made for labs tomorrow.  Instructions for cath lab (letter) placed at front desk.  Pt will pick up tomorrow as well.

## 2017-02-24 ENCOUNTER — Telehealth: Payer: Self-pay | Admitting: *Deleted

## 2017-02-24 ENCOUNTER — Other Ambulatory Visit: Payer: Medicare Other | Admitting: *Deleted

## 2017-02-24 DIAGNOSIS — Z01812 Encounter for preprocedural laboratory examination: Secondary | ICD-10-CM

## 2017-02-24 LAB — BASIC METABOLIC PANEL
BUN/Creatinine Ratio: 13 (ref 12–28)
BUN: 13 mg/dL (ref 8–27)
CALCIUM: 9.8 mg/dL (ref 8.7–10.3)
CO2: 21 mmol/L (ref 20–29)
Chloride: 104 mmol/L (ref 96–106)
Creatinine, Ser: 0.99 mg/dL (ref 0.57–1.00)
GFR calc Af Amer: 65 mL/min/{1.73_m2} (ref >59)
GFR calc non Af Amer: 57 mL/min/{1.73_m2} — ABNORMAL LOW (ref >59)
Glucose: 89 mg/dL (ref 65–99)
POTASSIUM: 4.3 mmol/L (ref 3.5–5.2)
Sodium: 135 mmol/L (ref 134–144)

## 2017-02-24 LAB — PROTIME-INR
INR: 1 (ref 0.8–1.2)
Prothrombin Time: 11.1 s (ref 9.1–12.0)

## 2017-02-24 LAB — CBC
HEMATOCRIT: 31.3 % — AB (ref 34.0–46.6)
HEMOGLOBIN: 10.1 g/dL — AB (ref 11.1–15.9)
MCH: 24.6 pg — ABNORMAL LOW (ref 26.6–33.0)
MCHC: 32.3 g/dL (ref 31.5–35.7)
MCV: 76 fL — ABNORMAL LOW (ref 79–97)
Platelets: 291 10*3/uL (ref 150–379)
RBC: 4.11 x10E6/uL (ref 3.77–5.28)
RDW: 17.3 % — ABNORMAL HIGH (ref 12.3–15.4)
WBC: 7.1 10*3/uL (ref 3.4–10.8)

## 2017-02-24 NOTE — Telephone Encounter (Signed)
Heart cath scheduled at [facility] for: Friday February 15,2019 1:30 PM Arrival time and place: [place] at: 11:30 AM   AM meds can be  taken pre-cath with sip of water including: ASA 81 mg am of cath  Confirm patient has responsible person to drive home post procedure and observe patient for 24 hours   Pt not at home, will try later today.

## 2017-02-24 NOTE — Telephone Encounter (Signed)
I spoke with patient, verified and discussed instructions for cath, pt verbalized understanding, thanked me for the call.

## 2017-02-25 ENCOUNTER — Encounter (HOSPITAL_COMMUNITY)
Admission: RE | Disposition: A | Discharge status: Home / Self Care | Payer: Self-pay | Source: Ambulatory Visit | Attending: Interventional Cardiology

## 2017-02-25 ENCOUNTER — Ambulatory Visit (HOSPITAL_COMMUNITY)
Admission: RE | Admit: 2017-02-25 | Discharge: 2017-02-25 | Disposition: A | Discharge status: Home / Self Care | Payer: Medicare Other | Source: Ambulatory Visit | Attending: Interventional Cardiology | Admitting: Interventional Cardiology

## 2017-02-25 DIAGNOSIS — M8580 Other specified disorders of bone density and structure, unspecified site: Secondary | ICD-10-CM | POA: Insufficient documentation

## 2017-02-25 DIAGNOSIS — G4700 Insomnia, unspecified: Secondary | ICD-10-CM | POA: Insufficient documentation

## 2017-02-25 DIAGNOSIS — I25118 Atherosclerotic heart disease of native coronary artery with other forms of angina pectoris: Secondary | ICD-10-CM | POA: Diagnosis not present

## 2017-02-25 DIAGNOSIS — J449 Chronic obstructive pulmonary disease, unspecified: Secondary | ICD-10-CM | POA: Diagnosis not present

## 2017-02-25 DIAGNOSIS — Z885 Allergy status to narcotic agent status: Secondary | ICD-10-CM | POA: Diagnosis not present

## 2017-02-25 DIAGNOSIS — I110 Hypertensive heart disease with heart failure: Secondary | ICD-10-CM | POA: Diagnosis not present

## 2017-02-25 DIAGNOSIS — K219 Gastro-esophageal reflux disease without esophagitis: Secondary | ICD-10-CM | POA: Diagnosis not present

## 2017-02-25 DIAGNOSIS — E7800 Pure hypercholesterolemia, unspecified: Secondary | ICD-10-CM | POA: Insufficient documentation

## 2017-02-25 DIAGNOSIS — F17210 Nicotine dependence, cigarettes, uncomplicated: Secondary | ICD-10-CM | POA: Diagnosis not present

## 2017-02-25 DIAGNOSIS — Z823 Family history of stroke: Secondary | ICD-10-CM | POA: Diagnosis not present

## 2017-02-25 DIAGNOSIS — I509 Heart failure, unspecified: Secondary | ICD-10-CM | POA: Insufficient documentation

## 2017-02-25 DIAGNOSIS — I2510 Atherosclerotic heart disease of native coronary artery without angina pectoris: Secondary | ICD-10-CM

## 2017-02-25 DIAGNOSIS — Z7982 Long term (current) use of aspirin: Secondary | ICD-10-CM | POA: Diagnosis not present

## 2017-02-25 HISTORY — PX: LEFT HEART CATH AND CORONARY ANGIOGRAPHY: CATH118249

## 2017-02-25 LAB — GLUCOSE, CAPILLARY: Glucose-Capillary: 104 mg/dL — ABNORMAL HIGH (ref 65–99)

## 2017-02-25 SURGERY — LEFT HEART CATH AND CORONARY ANGIOGRAPHY
Anesthesia: LOCAL

## 2017-02-25 MED ORDER — FENTANYL CITRATE (PF) 100 MCG/2ML IJ SOLN
INTRAMUSCULAR | Status: AC
  Filled 2017-02-25: qty 2

## 2017-02-25 MED ORDER — HEPARIN SODIUM (PORCINE) 1000 UNIT/ML IJ SOLN
INTRAMUSCULAR | Status: AC
  Filled 2017-02-25: qty 1

## 2017-02-25 MED ORDER — HEPARIN (PORCINE) IN NACL 2-0.9 UNIT/ML-% IJ SOLN
INTRAMUSCULAR | Status: AC
  Filled 2017-02-25: qty 1000

## 2017-02-25 MED ORDER — MIDAZOLAM HCL 2 MG/2ML IJ SOLN
INTRAMUSCULAR | Status: DC | PRN
  Administered 2017-02-25: 2 mg via INTRAVENOUS

## 2017-02-25 MED ORDER — SODIUM CHLORIDE 0.9% FLUSH: 3.0000 mL | INTRAVENOUS | Status: DC | PRN

## 2017-02-25 MED ORDER — SODIUM CHLORIDE 0.9 % WEIGHT BASED INFUSION
3.0000 mL/kg/h | INTRAVENOUS | Status: AC
  Administered 2017-02-25: 3 mL/kg/h via INTRAVENOUS

## 2017-02-25 MED ORDER — SODIUM CHLORIDE 0.9 % WEIGHT BASED INFUSION: 1.0000 mL/kg/h | INTRAVENOUS | Status: DC

## 2017-02-25 MED ORDER — HEPARIN (PORCINE) IN NACL 2-0.9 UNIT/ML-% IJ SOLN
INTRAMUSCULAR | Status: AC | PRN
  Administered 2017-02-25 (×2): 500 mL

## 2017-02-25 MED ORDER — IOPAMIDOL (ISOVUE-370) INJECTION 76%
INTRAVENOUS | Status: AC
  Filled 2017-02-25: qty 50

## 2017-02-25 MED ORDER — SODIUM CHLORIDE 0.9 % IV SOLN: 250.0000 mL | INTRAVENOUS | Status: DC | PRN

## 2017-02-25 MED ORDER — VERAPAMIL HCL 2.5 MG/ML IV SOLN
INTRAVENOUS | Status: AC
  Filled 2017-02-25: qty 2

## 2017-02-25 MED ORDER — SODIUM CHLORIDE 0.9% FLUSH: 3.0000 mL | Freq: Two times a day (BID) | INTRAVENOUS | Status: DC

## 2017-02-25 MED ORDER — LIDOCAINE HCL 1 % IJ SOLN
INTRAMUSCULAR | Status: AC
  Filled 2017-02-25: qty 20

## 2017-02-25 MED ORDER — FENTANYL CITRATE (PF) 100 MCG/2ML IJ SOLN
INTRAMUSCULAR | Status: DC | PRN
  Administered 2017-02-25: 25 ug via INTRAVENOUS

## 2017-02-25 MED ORDER — LIDOCAINE HCL (PF) 1 % IJ SOLN
INTRAMUSCULAR | Status: DC | PRN
  Administered 2017-02-25: 2 mL

## 2017-02-25 MED ORDER — IOPAMIDOL (ISOVUE-370) INJECTION 76%
INTRAVENOUS | Status: DC | PRN
  Administered 2017-02-25: 44.1 mL

## 2017-02-25 MED ORDER — ASPIRIN 81 MG PO CHEW: 81.0000 mg | CHEWABLE_TABLET | ORAL | Status: DC

## 2017-02-25 MED ORDER — SODIUM CHLORIDE 0.9 % IV SOLN: INTRAVENOUS | Status: AC

## 2017-02-25 MED ORDER — VERAPAMIL HCL 2.5 MG/ML IV SOLN
INTRAVENOUS | Status: DC | PRN
  Administered 2017-02-25 (×2): 10 mL via INTRA_ARTERIAL

## 2017-02-25 MED ORDER — MIDAZOLAM HCL 2 MG/2ML IJ SOLN
INTRAMUSCULAR | Status: AC
  Filled 2017-02-25: qty 2

## 2017-02-25 MED ORDER — HEPARIN SODIUM (PORCINE) 1000 UNIT/ML IJ SOLN
INTRAMUSCULAR | Status: DC | PRN
  Administered 2017-02-25: 4000 [IU] via INTRAVENOUS

## 2017-02-25 SURGICAL SUPPLY — 11 items
CATH INFINITI 5 FR JL3.5 (CATHETERS) ×1 IMPLANT
CATH INFINITI JR4 5F (CATHETERS) ×1 IMPLANT
DEVICE RAD COMP TR BAND LRG (VASCULAR PRODUCTS) ×1 IMPLANT
GLIDESHEATH SLEND SS 6F .021 (SHEATH) ×1 IMPLANT
GUIDEWIRE INQWIRE 1.5J.035X260 (WIRE) IMPLANT
INQWIRE 1.5J .035X260CM (WIRE) ×2
KIT PREMIUM HAND CONTROLLER (KITS) ×1 IMPLANT
KIT SINGLE USE MANIFOLD (KITS) ×1 IMPLANT
PACK CARDIAC CATHETERIZATION (CUSTOM PROCEDURE TRAY) ×2 IMPLANT
PROTECTION STATION PRESSURIZED (MISCELLANEOUS) ×2
STATION PROTECTION PRESSURIZED (MISCELLANEOUS) IMPLANT

## 2017-02-25 NOTE — Discharge Instructions (Signed)

## 2017-02-25 NOTE — H&P (View-Only) (Signed)
Admit date: 02/25/2017 Referring PhysicianDr. Harrington Challenger Primary Cardiologist*  Dr. Harrington Challenger Chief complaint/reason for admission:CP  HPI: 72 y/o with a history of chest pain, fatigue  I saw her in 2012  Myovue after was normal   Hx of HTN, aortic atherosclerosis, GERD,   LDL in Dec 95  HDL 36    Pt was referred by Dr Ardeth Perfect for CP    Pt has had 1 to 2 episodes of CP with dyspnea at rest per month  PT complains of tightness.  Not associated with activity  ALos complains of sharp cramp under L breast then back  Again, not associated with activity    Not too active  Gets tired  SOB of breath all the time       PMH:    Past Medical History:  Diagnosis Date  . Anemia   . Angina at rest [facility])   . Atherosclerosis   . CHF (congestive heart failure) ([place])   . COPD (chronic obstructive pulmonary disease) ([place])   . Coronary artery calcification   . Current every day smoker 11/15/2014   Counseled on quitting   . Esophageal reflux   . Gallstones   . GERD (gastroesophageal reflux disease)   . Herniated disc   . History of chest pain   . History of herniated intervertebral disc   . Hot flashes   . Hypercholesterolemia   . Hypertension   . Insomnia   . Lung nodule   . Osteopenia   . Pancreatic cyst   . PNA (pneumonia)   . Tobacco use   . Vaginal dryness     PSH:    Past Surgical History:  Procedure Laterality Date  . CHOLECYSTECTOMY      ALLERGIES:   Vicodin [hydrocodone-acetaminophen]  Prior to Admit Meds:   Medications Prior to Admission  Medication Sig Dispense Refill Last Dose  . amLODipine (NORVASC) 10 MG tablet Take 1 tablet (10 mg total) by mouth daily. (Patient taking differently: Take 10 mg by mouth at bedtime. ) 30 tablet 1 02/24/2017 at Unknown time  . aspirin EC 81 MG tablet Take 81 mg by mouth daily.   02/25/2017 at [place]  . Calcium Carb-Cholecalciferol 600-400 MG-UNIT TABS Take 1 tablet by mouth daily.   02/25/2017 at Unknown time  . celecoxib (CELEBREX) 200  MG capsule Take 200 mg by mouth daily.   02/24/2017 at Unknown time  . Chlorpheniramine Maleate (ALLERGY RELIEF PO) Take 1 tablet by mouth daily.    02/25/2017 at Unknown time  . losartan (COZAAR) 50 MG tablet Take 50 mg by mouth daily.   02/25/2017 at Unknown time  . nitroGLYCERIN (NITROSTAT) 0.4 MG SL tablet Place 0.4 mg under the tongue every 5 (five) minutes as needed for chest pain.   Past Week at Unknown time  . omeprazole (PRILOSEC) 40 MG capsule Take 40 mg by mouth at bedtime.    02/24/2017 at Unknown time  . rosuvastatin (CRESTOR) 10 MG tablet Take 1 tablet (10 mg total) by mouth daily. 90 tablet 3 02/25/2017 at Unknown time  . topiramate (TOPAMAX) 50 MG tablet Take 100 mg by mouth daily.    02/24/2017 at Unknown time  . zolpidem (AMBIEN) 10 MG tablet Take 10 mg by mouth at bedtime.    02/24/2017 at Unknown time   Family HX:    Family History  Problem Relation Age of Onset  . Stroke Mother   . Diabetes Mother   . CVA Mother   .  Heart attack Father   . Cancer Sister   . Diabetes Sister   . Arthritis Sister   . CVA Brother   . Lung cancer Brother   . Prostate cancer Brother    Social HX:    Social History   Socioeconomic History  . Marital status: Widowed    Spouse name: Not on file  . Number of children: Not on file  . Years of education: Not on file  . Highest education level: Not on file  Social Needs  . Financial resource strain: Not on file  . Food insecurity - worry: Not on file  . Food insecurity - inability: Not on file  . Transportation needs - medical: Not on file  . Transportation needs - non-medical: Not on file  Occupational History  . Not on file  Tobacco Use  . Smoking status: Current Every Day Smoker    Packs/day: 1.00    Years: 55.00    Pack years: 55.00    Types: Cigarettes  . Smokeless tobacco: Never Used  Substance and Sexual Activity  . Alcohol use: No    Alcohol/week: 0.0 oz  . Drug use: Not on file  . Sexual activity: Not on file  Other  Topics Concern  . Not on file  Social History Narrative  . Not on file     ROS:  All 11 ROS were addressed and are negative except what is stated in the HPI  PHYSICAL EXAM Vitals:   02/25/17 1043 02/25/17 1158  BP: (!) 158/73   Pulse: 77   Resp:  18  Temp: 97.8 F (36.6 C)   SpO2: 100%    General: Well developed, well nourished, in no acute distress Head: Eyes PERRLA, No xanthomas.   Normal cephalic and atramatic  Lungs:   Clear bilaterally to auscultation and percussion. Heart:   HRRR S1 S2 Pulses are 2+ & equal.            No carotid bruit. No JVD.  No abdominal bruits. No femoral bruits. Abdomen: Bowel sounds are positive, abdomen soft and non-tender without masses or                  Hernia's noted. Msk:  Back normal, normal gait. Normal strength and tone for age. Extremities:   No clubbing, cyanosis or edema.  DP +1 Neuro: Alert and oriented X 3. Psych:  Good affect, responds appropriately   Labs:   Lab Results  Component Value Date   WBC 7.1 02/24/2017   HGB 10.1 (L) 02/24/2017   HCT 31.3 (L) 02/24/2017   MCV 76 (L) 02/24/2017   PLT 291 02/24/2017    Recent Labs  Lab 02/24/17 1227  NA 135  K 4.3  CL 104  CO2 21  BUN 13  CREATININE 0.99  CALCIUM 9.8  GLUCOSE 89   No results found for: CKTOTAL, CKMB, CKMBINDEX, TROPONINI No results found for: PTT Lab Results  Component Value Date   INR 1.0 02/24/2017    No results found for: CHOL No results found for: HDL No results found for: LDLCALC No results found for: TRIG No results found for: CHOLHDL No results found for: LDLDIRECT    Radiology:  No results found.  EKG:    ASSESSMENT: Atypical chest pain  PLAN:  1) Plan for cath. Abnormal Coronary CT.  She had PTCA many years ago.  Larae Grooms, MD  02/25/2017  12:13 PM

## 2017-02-25 NOTE — Interval H&P Note (Signed)
Cath Lab Visit (complete for each Cath Lab visit)  Clinical Evaluation Leading to the Procedure:   ACS: No.  Non-ACS:    Anginal Classification: CCS III  Anti-ischemic medical therapy: Minimal Therapy (1 class of medications)  Non-Invasive Test Results: Intermediate-risk stress test findings: cardiac mortality 1-3%/year  Prior CABG: No previous CABG      History and Physical Interval Note:  02/25/2017 12:53 PM  Autumn Mckinney  has presented today for surgery, with the diagnosis of shortness of breath - cp  The various methods of treatment have been discussed with the patient and family. After consideration of risks, benefits and other options for treatment, the patient has consented to  Procedure(s): LEFT HEART CATH AND CORONARY ANGIOGRAPHY (N/A) as a surgical intervention .  The patient's history has been reviewed, patient examined, no change in status, stable for surgery.  I have reviewed the patient's chart and labs.  Questions were answered to the patient's satisfaction.     Larae Grooms

## 2017-02-25 NOTE — Progress Notes (Signed)
Admit date: 02/25/2017 Referring PhysicianDr. Harrington Challenger Primary Cardiologist*  Dr. Harrington Challenger Chief complaint/reason for admission:CP  HPI: 72 y/o with a history of chest pain, fatigue  I saw her in 2012  Myovue after was normal   Hx of HTN, aortic atherosclerosis, GERD,   LDL in Dec 95  HDL 36    Pt was referred by Dr Ardeth Perfect for CP    Pt has had 1 to 2 episodes of CP with dyspnea at rest per month  PT complains of tightness.  Not associated with activity  ALos complains of sharp cramp under L breast then back  Again, not associated with activity    Not too active  Gets tired  SOB of breath all the time       PMH:    Past Medical History:  Diagnosis Date  . Anemia   . Angina at rest [facility])   . Atherosclerosis   . CHF (congestive heart failure) ([place])   . COPD (chronic obstructive pulmonary disease) ([place])   . Coronary artery calcification   . Current every day smoker 11/15/2014   Counseled on quitting   . Esophageal reflux   . Gallstones   . GERD (gastroesophageal reflux disease)   . Herniated disc   . History of chest pain   . History of herniated intervertebral disc   . Hot flashes   . Hypercholesterolemia   . Hypertension   . Insomnia   . Lung nodule   . Osteopenia   . Pancreatic cyst   . PNA (pneumonia)   . Tobacco use   . Vaginal dryness     PSH:    Past Surgical History:  Procedure Laterality Date  . CHOLECYSTECTOMY      ALLERGIES:   Vicodin [hydrocodone-acetaminophen]  Prior to Admit Meds:   Medications Prior to Admission  Medication Sig Dispense Refill Last Dose  . amLODipine (NORVASC) 10 MG tablet Take 1 tablet (10 mg total) by mouth daily. (Patient taking differently: Take 10 mg by mouth at bedtime. ) 30 tablet 1 02/24/2017 at Unknown time  . aspirin EC 81 MG tablet Take 81 mg by mouth daily.   02/25/2017 at [place]  . Calcium Carb-Cholecalciferol 600-400 MG-UNIT TABS Take 1 tablet by mouth daily.   02/25/2017 at Unknown time  . celecoxib (CELEBREX) 200  MG capsule Take 200 mg by mouth daily.   02/24/2017 at Unknown time  . Chlorpheniramine Maleate (ALLERGY RELIEF PO) Take 1 tablet by mouth daily.    02/25/2017 at Unknown time  . losartan (COZAAR) 50 MG tablet Take 50 mg by mouth daily.   02/25/2017 at Unknown time  . nitroGLYCERIN (NITROSTAT) 0.4 MG SL tablet Place 0.4 mg under the tongue every 5 (five) minutes as needed for chest pain.   Past Week at Unknown time  . omeprazole (PRILOSEC) 40 MG capsule Take 40 mg by mouth at bedtime.    02/24/2017 at Unknown time  . rosuvastatin (CRESTOR) 10 MG tablet Take 1 tablet (10 mg total) by mouth daily. 90 tablet 3 02/25/2017 at Unknown time  . topiramate (TOPAMAX) 50 MG tablet Take 100 mg by mouth daily.    02/24/2017 at Unknown time  . zolpidem (AMBIEN) 10 MG tablet Take 10 mg by mouth at bedtime.    02/24/2017 at Unknown time   Family HX:    Family History  Problem Relation Age of Onset  . Stroke Mother   . Diabetes Mother   . CVA Mother   .  Heart attack Father   . Cancer Sister   . Diabetes Sister   . Arthritis Sister   . CVA Brother   . Lung cancer Brother   . Prostate cancer Brother    Social HX:    Social History   Socioeconomic History  . Marital status: Widowed    Spouse name: Not on file  . Number of children: Not on file  . Years of education: Not on file  . Highest education level: Not on file  Social Needs  . Financial resource strain: Not on file  . Food insecurity - worry: Not on file  . Food insecurity - inability: Not on file  . Transportation needs - medical: Not on file  . Transportation needs - non-medical: Not on file  Occupational History  . Not on file  Tobacco Use  . Smoking status: Current Every Day Smoker    Packs/day: 1.00    Years: 55.00    Pack years: 55.00    Types: Cigarettes  . Smokeless tobacco: Never Used  Substance and Sexual Activity  . Alcohol use: No    Alcohol/week: 0.0 oz  . Drug use: Not on file  . Sexual activity: Not on file  Other  Topics Concern  . Not on file  Social History Narrative  . Not on file     ROS:  All 11 ROS were addressed and are negative except what is stated in the HPI  PHYSICAL EXAM Vitals:   02/25/17 1043 02/25/17 1158  BP: (!) 158/73   Pulse: 77   Resp:  18  Temp: 97.8 F (36.6 C)   SpO2: 100%    General: Well developed, well nourished, in no acute distress Head: Eyes PERRLA, No xanthomas.   Normal cephalic and atramatic  Lungs:   Clear bilaterally to auscultation and percussion. Heart:   HRRR S1 S2 Pulses are 2+ & equal.            No carotid bruit. No JVD.  No abdominal bruits. No femoral bruits. Abdomen: Bowel sounds are positive, abdomen soft and non-tender without masses or                  Hernia's noted. Msk:  Back normal, normal gait. Normal strength and tone for age. Extremities:   No clubbing, cyanosis or edema.  DP +1 Neuro: Alert and oriented X 3. Psych:  Good affect, responds appropriately   Labs:   Lab Results  Component Value Date   WBC 7.1 02/24/2017   HGB 10.1 (L) 02/24/2017   HCT 31.3 (L) 02/24/2017   MCV 76 (L) 02/24/2017   PLT 291 02/24/2017    Recent Labs  Lab 02/24/17 1227  NA 135  K 4.3  CL 104  CO2 21  BUN 13  CREATININE 0.99  CALCIUM 9.8  GLUCOSE 89   No results found for: CKTOTAL, CKMB, CKMBINDEX, TROPONINI No results found for: PTT Lab Results  Component Value Date   INR 1.0 02/24/2017    No results found for: CHOL No results found for: HDL No results found for: LDLCALC No results found for: TRIG No results found for: CHOLHDL No results found for: LDLDIRECT    Radiology:  No results found.  EKG:    ASSESSMENT: Atypical chest pain  PLAN:  1) Plan for cath. Abnormal Coronary CT.  She had PTCA many years ago.  Larae Grooms, MD  02/25/2017  12:13 PM

## 2017-02-28 ENCOUNTER — Encounter (HOSPITAL_COMMUNITY): Payer: Self-pay | Admitting: Interventional Cardiology

## 2017-02-28 MED FILL — Lidocaine HCl Local Inj 1%: INTRAMUSCULAR | Qty: 20 | Status: AC

## 2017-02-28 MED FILL — Heparin Sodium (Porcine) 2 Unit/ML in Sodium Chloride 0.9%: INTRAMUSCULAR | Qty: 1000 | Status: AC

## 2017-03-24 ENCOUNTER — Telehealth: Payer: Self-pay | Admitting: *Deleted

## 2017-03-24 NOTE — Telephone Encounter (Signed)
Called patient to schedule FLP due soon since starting crestor. Since cath she still gets SOB and has pains in her back.  Maybe little better, otherwise same as prior to cath. Scheduled follow up in office with Dr. Harrington Challenger. She is ok to wait until May, offered sooner appointment with APP.  Feels she is fine waiting and will call back if needs sooner appt. Lab appt for lipids scheduled for 05/06/17. Phone number updated. At the end of March pt is moving in with her son.

## 2017-05-06 ENCOUNTER — Other Ambulatory Visit: Payer: Medicare Other | Admitting: *Deleted

## 2017-05-06 DIAGNOSIS — I25119 Atherosclerotic heart disease of native coronary artery with unspecified angina pectoris: Secondary | ICD-10-CM

## 2017-05-06 DIAGNOSIS — E785 Hyperlipidemia, unspecified: Secondary | ICD-10-CM

## 2017-05-06 LAB — LIPID PANEL
CHOL/HDL RATIO: 2.1 ratio (ref 0.0–4.4)
Cholesterol, Total: 90 mg/dL — ABNORMAL LOW (ref 100–199)
HDL: 43 mg/dL (ref >39)
LDL CALC: 31 mg/dL (ref 0–99)
Triglycerides: 82 mg/dL (ref 0–149)
VLDL CHOLESTEROL CAL: 16 mg/dL (ref 5–40)

## 2017-05-13 ENCOUNTER — Other Ambulatory Visit: Payer: Self-pay | Admitting: *Deleted

## 2017-05-13 DIAGNOSIS — E785 Hyperlipidemia, unspecified: Secondary | ICD-10-CM

## 2017-05-13 MED ORDER — ROSUVASTATIN CALCIUM 5 MG PO TABS: 5.0000 mg | ORAL_TABLET | Freq: Every day | ORAL | 3 refills | Status: DC

## 2017-05-13 NOTE — Progress Notes (Signed)
Notes recorded by Fay Records, MD on 05/10/2017 at 12:59 AM EDT Excellent response to Crestor   LDL 31 Would cut crestor to 5 mg   F/U lpids in September

## 2017-05-20 ENCOUNTER — Encounter: Payer: Self-pay | Admitting: Internal Medicine

## 2017-05-20 ENCOUNTER — Ambulatory Visit (INDEPENDENT_AMBULATORY_CARE_PROVIDER_SITE_OTHER): Payer: Medicare Other | Admitting: Internal Medicine

## 2017-05-20 VITALS — BP 136/84 | HR 66 | Ht 61.5 in | Wt 152.8 lb

## 2017-05-20 DIAGNOSIS — E785 Hyperlipidemia, unspecified: Secondary | ICD-10-CM | POA: Diagnosis not present

## 2017-05-20 DIAGNOSIS — I10 Essential (primary) hypertension: Secondary | ICD-10-CM | POA: Diagnosis not present

## 2017-05-20 DIAGNOSIS — I25119 Atherosclerotic heart disease of native coronary artery with unspecified angina pectoris: Secondary | ICD-10-CM

## 2017-05-20 NOTE — Progress Notes (Signed)
Cardiology Office Note   Date:  05/20/2017   ID:  JAKHIA BUXTON, DOB 1945/07/17, MRN [id number]  PCP:  Velna Hatchet, MD  Cardiologist:   Dorris Carnes, MD    PT presents for f/u of CP and CAD    History of Present Illness: Autumn Mckinney is a 72 y.o. female with a history of HTN, GERD,  chest pain, fatigue  I saw her in 2012  Myovue after was normal   I sawa her again in Jan (referred by Dr Ardeth Perfect)  CT scan was sugg of signif dz    She Went on to have L heart cath   Ths showed only moderate narrowings    Since seen she notes rare episodes of CP   Breathing is steady    Current Meds  Medication Sig  . amLODipine (NORVASC) 10 MG tablet Take 10 mg by mouth at bedtime.  Marland Kitchen aspirin EC 81 MG tablet Take 81 mg by mouth daily.  . Calcium Carb-Cholecalciferol 600-400 MG-UNIT TABS Take 1 tablet by mouth daily.  . celecoxib (CELEBREX) 200 MG capsule Take 200 mg by mouth daily.  . Chlorpheniramine Maleate (ALLERGY RELIEF PO) Take 1 tablet by mouth daily.   Marland Kitchen losartan (COZAAR) 50 MG tablet Take 50 mg by mouth daily.  . nitroGLYCERIN (NITROSTAT) 0.4 MG SL tablet Place 0.4 mg under the tongue every 5 (five) minutes as needed for chest pain.  Marland Kitchen omeprazole (PRILOSEC) 40 MG capsule Take 40 mg by mouth at bedtime.   . rosuvastatin (CRESTOR) 5 MG tablet Take 1 tablet (5 mg total) by mouth daily.  Marland Kitchen topiramate (TOPAMAX) 50 MG tablet Take 100 mg by mouth daily.   Marland Kitchen zolpidem (AMBIEN) 10 MG tablet Take 10 mg by mouth at bedtime.      Allergies:   Vicodin [hydrocodone-acetaminophen]   Past Medical History:  Diagnosis Date  . Anemia   . Angina at rest [facility])   . Atherosclerosis   . CHF (congestive heart failure) ([place])   . COPD (chronic obstructive pulmonary disease) ([place])   . Coronary artery calcification   . Current every day smoker 11/15/2014   Counseled on quitting   . Esophageal reflux   . Gallstones   . GERD (gastroesophageal reflux disease)   . Herniated disc   . History of chest  pain   . History of herniated intervertebral disc   . Hot flashes   . Hypercholesterolemia   . Hypertension   . Insomnia   . Lung nodule   . Osteopenia   . Pancreatic cyst   . PNA (pneumonia)   . Tobacco use   . Vaginal dryness     Past Surgical History:  Procedure Laterality Date  . CHOLECYSTECTOMY    . LEFT HEART CATH AND CORONARY ANGIOGRAPHY N/A 02/25/2017   Procedure: LEFT HEART CATH AND CORONARY ANGIOGRAPHY;  Surgeon: Jettie Booze, MD;  Location: [facility];  Service: Cardiovascular;  Laterality: N/A;     Social History:  The patient  reports that she has been smoking cigarettes.  She has a 55.00 pack-year smoking history. She has never used smokeless tobacco. She reports that she does not drink alcohol.   Family History:  The patient's family history includes Arthritis in her sister; CVA in her brother and mother; Cancer in her sister; Diabetes in her mother and sister; Heart attack in her father; Lung cancer in her brother; Prostate cancer in her brother; Stroke in her mother.    ROS:  Please see the history of present illness. All other systems are reviewed and  Negative to the above problem except as noted.    PHYSICAL EXAM: VS:  BP 136/84   Pulse 66   Ht 5' 1.5" (1.562 m)   Wt 152 lb 12.8 oz (69.3 kg)   SpO2 99%   BMI 28.40 kg/m   GEN: Well nourished, well developed, in no acute distress  HEENT: normal  Neck: JVP is normal   No   carotid bruits, or masses Cardiac: RRR; no murmurs, rubs, or gallops,no edema  Respiratory:  clear to auscultation bilaterally, normal work of breathing GI: soft, nontender, nondistended, + BS  No hepatomegaly  MS: no deformity Moving all extremities   Skin: warm and dry, no rash Neuro:  Strength and sensation are intact Psych: euthymic mood, full affect   EKG:  EKG is not ordered today.    Lipid Panel    Component Value Date/Time   CHOL 90 (L) 05/06/2017 0758   TRIG 82 05/06/2017 0758   HDL 43 05/06/2017  0758   CHOLHDL 2.1 05/06/2017 0758   LDLCALC 31 05/06/2017 0758      Wt Readings from Last 3 Encounters:  05/20/17 152 lb 12.8 oz (69.3 kg)  02/25/17 149 lb (67.6 kg)  01/21/17 157 lb (71.2 kg)      ASSESSMENT AND PLAN:  1  CAD  Moderate by cth   No symptoms to sugg angina     2  HL   Co Crestor  LDL 31  Keep on this  3   HTN  BP is well controlled       Signed, Dorris Carnes, MD  05/20/2017 10:05 AM    [facility] [address], [place], [place]  [address]   ADDENDUM: PT underwent coronary CT angiogram  Calcium scor 845 LM 50 to 75% ostial; LAD 50 -75% ostial   50 to 75% mid LCxLess than 50%  RCA:  50% prox  PLSA 50 to 75%  FFR showed LM normal  RCA prox normal RCA 0.84  PLSA 0.73 normal   LAD 0.88 and distal LAD 0.83  Based on findings would recomm L heart cath tod define anatomy  Risks / benefits described  Pt understands and agrees to proceed.  Dorris Carnes   Pt Phone: [phone]; Fax: [phone]

## 2017-05-20 NOTE — Patient Instructions (Signed)
Your physician recommends that you continue on your current medications as directed. Please refer to the Current Medication list given to you today. Your physician wants you to follow-up in: Dec/Jan with Dr. Harrington Challenger.  You will receive a reminder letter in the mail two months in advance. If you don't receive a letter, please call our office to schedule the follow-up appointment.

## 2017-10-17 ENCOUNTER — Telehealth: Payer: Self-pay | Admitting: Internal Medicine

## 2017-10-17 NOTE — Telephone Encounter (Signed)
Agree with recommendations.  

## 2017-10-17 NOTE — Telephone Encounter (Signed)
New Message:       Pt is wanting a call from the RN to discuss some things but didn't want to go into detail with me

## 2017-10-17 NOTE — Telephone Encounter (Signed)
Dizzy spell last Thursday around 11-11:30 am while shopping with daughter in law.  Pushing cart. This was accompanied by some sweating. Felt about to loose her breath.  Hadn't drank anything that day.  Only ate 2-3 crackers, had been to [employer] down, drank bottle of water started feeling better.  Had taken meds that day.   Was at PCP Wed. BP was 118/57.  Has felt ok since but has not done any activities like Thurs. No chest pain. Had cath in Feb, no intervention.  Pt hung up to check BP.  I called her back for readings: 131/69, 142/74.  Asked her to monitor BP a couple times a week and keep a list. Asked her to get enough water to drink, especially on the hotter days. Advised to change positin slowly to avoid possible sudden drops in BP.  Pt voices understanding and agreement and was appreciative for the information provided. She has been scheduled for f/u with Dr. Harrington Challenger in January.

## 2017-10-20 ENCOUNTER — Other Ambulatory Visit: Payer: Medicare Other

## 2017-11-21 ENCOUNTER — Ambulatory Visit
Admission: RE | Admit: 2017-11-21 | Discharge: 2017-11-21 | Disposition: A | Discharge status: Home / Self Care | Payer: Medicare Other | Source: Ambulatory Visit | Attending: Acute Care | Admitting: Acute Care

## 2017-11-21 DIAGNOSIS — Z122 Encounter for screening for malignant neoplasm of respiratory organs: Secondary | ICD-10-CM

## 2017-11-21 DIAGNOSIS — F17210 Nicotine dependence, cigarettes, uncomplicated: Secondary | ICD-10-CM

## 2017-11-24 ENCOUNTER — Other Ambulatory Visit: Payer: Self-pay | Admitting: Acute Care

## 2017-11-24 DIAGNOSIS — Z122 Encounter for screening for malignant neoplasm of respiratory organs: Secondary | ICD-10-CM

## 2017-11-24 DIAGNOSIS — F17210 Nicotine dependence, cigarettes, uncomplicated: Principal | ICD-10-CM

## 2017-12-16 ENCOUNTER — Encounter (HOSPITAL_COMMUNITY): Payer: Self-pay | Admitting: *Deleted

## 2017-12-16 ENCOUNTER — Inpatient Hospital Stay (HOSPITAL_COMMUNITY)
Admission: EM | Admit: 2017-12-16 | Discharge: 2017-12-20 | DRG: 812 | Disposition: A | Discharge status: Home / Self Care | Payer: Medicare Other | Attending: Internal Medicine | Admitting: Internal Medicine

## 2017-12-16 ENCOUNTER — Emergency Department (HOSPITAL_COMMUNITY): Payer: Medicare Other

## 2017-12-16 ENCOUNTER — Other Ambulatory Visit: Payer: Self-pay

## 2017-12-16 DIAGNOSIS — M8580 Other specified disorders of bone density and structure, unspecified site: Secondary | ICD-10-CM | POA: Diagnosis present

## 2017-12-16 DIAGNOSIS — D120 Benign neoplasm of cecum: Secondary | ICD-10-CM | POA: Diagnosis present

## 2017-12-16 DIAGNOSIS — E7800 Pure hypercholesterolemia, unspecified: Secondary | ICD-10-CM | POA: Diagnosis present

## 2017-12-16 DIAGNOSIS — Z79899 Other long term (current) drug therapy: Secondary | ICD-10-CM

## 2017-12-16 DIAGNOSIS — R634 Abnormal weight loss: Secondary | ICD-10-CM | POA: Diagnosis present

## 2017-12-16 DIAGNOSIS — K649 Unspecified hemorrhoids: Secondary | ICD-10-CM | POA: Diagnosis present

## 2017-12-16 DIAGNOSIS — D123 Benign neoplasm of transverse colon: Secondary | ICD-10-CM | POA: Diagnosis present

## 2017-12-16 DIAGNOSIS — G4700 Insomnia, unspecified: Secondary | ICD-10-CM | POA: Diagnosis present

## 2017-12-16 DIAGNOSIS — D122 Benign neoplasm of ascending colon: Secondary | ICD-10-CM | POA: Diagnosis present

## 2017-12-16 DIAGNOSIS — I2583 Coronary atherosclerosis due to lipid rich plaque: Secondary | ICD-10-CM | POA: Diagnosis not present

## 2017-12-16 DIAGNOSIS — I5032 Chronic diastolic (congestive) heart failure: Secondary | ICD-10-CM | POA: Diagnosis present

## 2017-12-16 DIAGNOSIS — K219 Gastro-esophageal reflux disease without esophagitis: Secondary | ICD-10-CM | POA: Diagnosis present

## 2017-12-16 DIAGNOSIS — I2510 Atherosclerotic heart disease of native coronary artery without angina pectoris: Secondary | ICD-10-CM | POA: Diagnosis present

## 2017-12-16 DIAGNOSIS — R1310 Dysphagia, unspecified: Secondary | ICD-10-CM | POA: Diagnosis present

## 2017-12-16 DIAGNOSIS — Z7982 Long term (current) use of aspirin: Secondary | ICD-10-CM | POA: Diagnosis not present

## 2017-12-16 DIAGNOSIS — I110 Hypertensive heart disease with heart failure: Secondary | ICD-10-CM | POA: Diagnosis present

## 2017-12-16 DIAGNOSIS — Z6826 Body mass index (BMI) 26.0-26.9, adult: Secondary | ICD-10-CM | POA: Diagnosis not present

## 2017-12-16 DIAGNOSIS — J449 Chronic obstructive pulmonary disease, unspecified: Secondary | ICD-10-CM | POA: Diagnosis present

## 2017-12-16 DIAGNOSIS — F17210 Nicotine dependence, cigarettes, uncomplicated: Secondary | ICD-10-CM | POA: Diagnosis present

## 2017-12-16 DIAGNOSIS — Z8719 Personal history of other diseases of the digestive system: Secondary | ICD-10-CM

## 2017-12-16 DIAGNOSIS — N179 Acute kidney failure, unspecified: Secondary | ICD-10-CM | POA: Diagnosis present

## 2017-12-16 DIAGNOSIS — E785 Hyperlipidemia, unspecified: Secondary | ICD-10-CM | POA: Diagnosis present

## 2017-12-16 DIAGNOSIS — Z885 Allergy status to narcotic agent status: Secondary | ICD-10-CM | POA: Diagnosis not present

## 2017-12-16 DIAGNOSIS — D500 Iron deficiency anemia secondary to blood loss (chronic): Secondary | ICD-10-CM

## 2017-12-16 DIAGNOSIS — F17200 Nicotine dependence, unspecified, uncomplicated: Secondary | ICD-10-CM | POA: Diagnosis present

## 2017-12-16 DIAGNOSIS — K922 Gastrointestinal hemorrhage, unspecified: Secondary | ICD-10-CM | POA: Diagnosis present

## 2017-12-16 DIAGNOSIS — D62 Acute posthemorrhagic anemia: Secondary | ICD-10-CM | POA: Diagnosis present

## 2017-12-16 DIAGNOSIS — K5730 Diverticulosis of large intestine without perforation or abscess without bleeding: Secondary | ICD-10-CM | POA: Diagnosis not present

## 2017-12-16 DIAGNOSIS — D649 Anemia, unspecified: Secondary | ICD-10-CM

## 2017-12-16 LAB — CBC
HCT: 20.3 % — ABNORMAL LOW (ref 36.0–46.0)
Hemoglobin: 5 g/dL — CL (ref 12.0–15.0)
MCH: 15.9 pg — ABNORMAL LOW (ref 26.0–34.0)
MCHC: 24.6 g/dL — ABNORMAL LOW (ref 30.0–36.0)
MCV: 64.6 fL — AB (ref 80.0–100.0)
Platelets: 414 10*3/uL — ABNORMAL HIGH (ref 150–400)
RBC: 3.14 MIL/uL — ABNORMAL LOW (ref 3.87–5.11)
RDW: 22.1 % — ABNORMAL HIGH (ref 11.5–15.5)
WBC: 5.9 10*3/uL (ref 4.0–10.5)
nRBC: 0.3 % — ABNORMAL HIGH (ref 0.0–0.2)

## 2017-12-16 LAB — COMPREHENSIVE METABOLIC PANEL
ALBUMIN: 3.6 g/dL (ref 3.5–5.0)
ALT: 13 U/L (ref 0–44)
AST: 24 U/L (ref 15–41)
Alkaline Phosphatase: 86 U/L (ref 38–126)
Anion gap: 13 (ref 5–15)
BUN: 18 mg/dL (ref 8–23)
CHLORIDE: 109 mmol/L (ref 98–111)
CO2: 15 mmol/L — AB (ref 22–32)
Calcium: 9.3 mg/dL (ref 8.9–10.3)
Creatinine, Ser: 1.29 mg/dL — ABNORMAL HIGH (ref 0.44–1.00)
GFR calc Af Amer: 47 mL/min — ABNORMAL LOW (ref >60)
GFR calc non Af Amer: 41 mL/min — ABNORMAL LOW (ref >60)
Glucose, Bld: 138 mg/dL — ABNORMAL HIGH (ref 70–99)
Potassium: 4 mmol/L (ref 3.5–5.1)
SODIUM: 137 mmol/L (ref 135–145)
Total Bilirubin: 0.3 mg/dL (ref 0.3–1.2)
Total Protein: 7.5 g/dL (ref 6.5–8.1)

## 2017-12-16 LAB — ABO/RH: ABO/RH(D): O POS

## 2017-12-16 LAB — PREPARE RBC (CROSSMATCH)

## 2017-12-16 LAB — IRON AND TIBC
Iron: 5 ug/dL — ABNORMAL LOW (ref 28–170)
Saturation Ratios: 1 % — ABNORMAL LOW (ref 10.4–31.8)
TIBC: 386 ug/dL (ref 250–450)
UIBC: 381 ug/dL

## 2017-12-16 LAB — FERRITIN: Ferritin: 5 ng/mL — ABNORMAL LOW (ref 11–307)

## 2017-12-16 LAB — POC OCCULT BLOOD, ED: FECAL OCCULT BLD: POSITIVE — AB

## 2017-12-16 MED ORDER — ACETAMINOPHEN 650 MG RE SUPP: 650.0000 mg | Freq: Four times a day (QID) | RECTAL | Status: DC | PRN

## 2017-12-16 MED ORDER — TRAZODONE HCL 50 MG PO TABS
25.0000 mg | ORAL_TABLET | Freq: Every evening | ORAL | Status: DC | PRN
  Administered 2017-12-17 – 2017-12-19 (×3): 25 mg via ORAL
  Filled 2017-12-16 (×3): qty 1

## 2017-12-16 MED ORDER — ACETAMINOPHEN 325 MG PO TABS
650.0000 mg | ORAL_TABLET | Freq: Four times a day (QID) | ORAL | Status: DC | PRN
  Administered 2017-12-18: 650 mg via ORAL
  Filled 2017-12-16: qty 2

## 2017-12-16 MED ORDER — ONDANSETRON HCL 4 MG PO TABS: 4.0000 mg | ORAL_TABLET | Freq: Four times a day (QID) | ORAL | Status: DC | PRN

## 2017-12-16 MED ORDER — ONDANSETRON HCL 4 MG/2ML IJ SOLN
4.0000 mg | Freq: Four times a day (QID) | INTRAMUSCULAR | Status: DC | PRN
  Administered 2017-12-17: 4 mg via INTRAVENOUS
  Filled 2017-12-16: qty 2

## 2017-12-16 MED ORDER — POTASSIUM CHLORIDE IN NACL 20-0.9 MEQ/L-% IV SOLN
INTRAVENOUS | Status: AC
  Administered 2017-12-16 – 2017-12-17 (×3): via INTRAVENOUS
  Filled 2017-12-16 (×3): qty 1000

## 2017-12-16 MED ORDER — TRAMADOL HCL 50 MG PO TABS
50.0000 mg | ORAL_TABLET | Freq: Four times a day (QID) | ORAL | Status: DC | PRN
  Administered 2017-12-16 – 2017-12-19 (×4): 50 mg via ORAL
  Filled 2017-12-16 (×4): qty 1

## 2017-12-16 MED ORDER — SODIUM CHLORIDE 0.9 % IV SOLN
10.0000 mL/h | Freq: Once | INTRAVENOUS | Status: AC
  Administered 2017-12-16: 10 mL/h via INTRAVENOUS

## 2017-12-16 MED ORDER — PEG 3350-KCL-NA BICARB-NACL 420 G PO SOLR
4000.0000 mL | Freq: Once | ORAL | Status: AC
  Administered 2017-12-16: 4000 mL via ORAL
  Filled 2017-12-16 (×3): qty 4000

## 2017-12-16 NOTE — H&P (View-Only) (Signed)
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  She states that over the past year her she has had problems with SOB, fatigue, and dizziness.  The patient feels that her symptoms started 8 months ago and they progressed.  Earlier in the year, 02/2017, she underwent a left heart cath and she only had mild to moderate CAD.  Her EF was estimated to be in the normal range.  In October she called Dr. Harrington Challenger complaining of SOB and dizziness, but her blood pressure was not hypotensive.  During a routine evaluation with her PCP her HGB was noted to be low and she was instructed to present to the ER.  In the ER she was found to have an HGB of 5.0 with an MCV of 64, which is a drop from her February HGB at 10 g/dL.  IN 2009 her HGB was in the 14 range.  Per her report, she states that she has dark stools and approximately 3-4 months ago she started to have an increase in bowel frequency.  Prior to that time she suffered with constipation.  In fact, when she was followed in the office she was prescribed Amitiza with good effect.  GERD is an issue for her and she did vomit a couple of months ago "dark material".  When she wipes after a bowel movement, she will see red blood.  Past Medical History:  Diagnosis Date  . Anemia   . Angina at rest [facility])   . Atherosclerosis   . CHF (congestive heart failure) ([place])   . COPD (chronic obstructive pulmonary disease) ([place])   . Coronary artery calcification   . Current every day smoker 11/15/2014   Counseled on quitting   . Esophageal reflux   . Gallstones   . GERD (gastroesophageal reflux disease)   . Herniated disc   . History of chest pain   . History of herniated intervertebral disc   . Hot flashes   . Hypercholesterolemia   . Hypertension   . Insomnia   . Lung nodule   . Osteopenia    . Pancreatic cyst   . PNA (pneumonia)   . Tobacco use   . Vaginal dryness     Past Surgical History:  Procedure Laterality Date  . CHOLECYSTECTOMY    . LEFT HEART CATH AND CORONARY ANGIOGRAPHY N/A 02/25/2017   Procedure: LEFT HEART CATH AND CORONARY ANGIOGRAPHY;  Surgeon: Jettie Booze, MD;  Location: [facility];  Service: Cardiovascular;  Laterality: N/A;    Family History  Problem Relation Age of Onset  . Stroke Mother   . Diabetes Mother   . CVA Mother   . Heart attack Father   . Cancer Sister   . Diabetes Sister   . Arthritis Sister   . CVA Brother   . Lung cancer Brother   . Prostate cancer Brother     Social History:  reports that she has been smoking cigarettes. She has a 11.00 pack-year smoking history. She has never used smokeless tobacco. She reports that she does not drink alcohol. Her drug history is not on file.  Allergies:  Allergies  Allergen Reactions  . Vicodin [Hydrocodone-Acetaminophen] Itching    Medications:  Scheduled:  Continuous: . 0.9 % NaCl with KCl 20 mEq / L  Results for orders placed or performed during the hospital encounter of 12/16/17 (from the past 24 hour(s))  Type and screen [place]     Status: None (Preliminary result)   Collection Time: 12/16/17 11:27 AM  Result Value Ref Range   ABO/RH(D) O POS    Antibody Screen NEG    Sample Expiration 12/19/2017    Unit Number [id number]    Blood Component Type RED CELLS,LR    Unit division 00    Status of Unit ISSUED    Transfusion Status OK TO TRANSFUSE    Crossmatch Result      Compatible Performed at [facility], [address] [place], [place] [address]    Unit Number [id number]    Blood Component Type RED CELLS,LR    Unit division 00    Status of Unit ALLOCATED    Transfusion Status OK TO TRANSFUSE    Crossmatch Result Compatible   ABO/Rh     Status: None   Collection Time: 12/16/17 11:27 AM  Result Value Ref Range    ABO/RH(D)      O POS Performed at [facility] [address] [place], [place] [address]   Comprehensive metabolic panel     Status: Abnormal   Collection Time: 12/16/17 11:32 AM  Result Value Ref Range   Sodium 137 135 - 145 mmol/L   Potassium 4.0 3.5 - 5.1 mmol/L   Chloride 109 98 - 111 mmol/L   CO2 15 (L) 22 - 32 mmol/L   Glucose, Bld 138 (H) 70 - 99 mg/dL   BUN 18 8 - 23 mg/dL   Creatinine, Ser 1.29 (H) 0.44 - 1.00 mg/dL   Calcium 9.3 8.9 - 10.3 mg/dL   Total Protein 7.5 6.5 - 8.1 g/dL   Albumin 3.6 3.5 - 5.0 g/dL   AST 24 15 - 41 U/L   ALT 13 0 - 44 U/L   Alkaline Phosphatase 86 38 - 126 U/L   Total Bilirubin 0.3 0.3 - 1.2 mg/dL   GFR calc non Af Amer 41 (L) >60 mL/min   GFR calc Af Amer 47 (L) >60 mL/min   Anion gap 13 5 - 15  CBC     Status: Abnormal   Collection Time: 12/16/17 11:32 AM  Result Value Ref Range   WBC 5.9 4.0 - 10.5 K/uL   RBC 3.14 (L) 3.87 - 5.11 MIL/uL   Hemoglobin 5.0 (LL) 12.0 - 15.0 g/dL   HCT 20.3 (L) 36.0 - 46.0 %   MCV 64.6 (L) 80.0 - 100.0 fL   MCH 15.9 (L) 26.0 - 34.0 pg   MCHC 24.6 (L) 30.0 - 36.0 g/dL   RDW 22.1 (H) 11.5 - 15.5 %   Platelets 414 (H) 150 - 400 K/uL   nRBC 0.3 (H) 0.0 - 0.2 %  Iron and TIBC     Status: Abnormal   Collection Time: 12/16/17  1:06 PM  Result Value Ref Range   Iron 5 (L) 28 - 170 ug/dL   TIBC 386 250 - 450 ug/dL   Saturation Ratios 1 (L) 10.4 - 31.8 %   UIBC 381 ug/dL  Ferritin (Iron Binding Protein)     Status: Abnormal   Collection Time: 12/16/17  1:06 PM  Result Value Ref Range   Ferritin 5 (L) 11 - 307 ng/mL  Prepare RBC     Status: None   Collection Time: 12/16/17  1:18 PM  Result Value Ref Range  Order Confirmation      ORDER PROCESSED BY BLOOD BANK Performed at [facility], [place] [address], [place], [place] [address]   POC occult blood, ED     Status: Abnormal   Collection Time: 12/16/17  2:02 PM  Result Value Ref Range   Fecal Occult Bld POSITIVE (A) NEGATIVE     Dg Chest  2 View  Result Date: 12/16/2017 CLINICAL DATA:  Weakness EXAM: CHEST - 2 VIEW COMPARISON:  11/21/2017 FINDINGS: Cardiac shadow is within normal limits. Aortic calcifications are seen. The lungs are well aerated without focal infiltrate or sizable effusion. No bony abnormality is noted. IMPRESSION: No acute abnormality noted. Electronically Signed   By: Inez Catalina M.D.   On: 12/16/2017 13:56    ROS:  As stated above in the HPI otherwise negative.  Blood pressure (!) 187/108, pulse 61, temperature 98 F (36.7 C), temperature source Oral, resp. rate 17, height 5\' 1"  (1.549 m), weight 63.5 kg, SpO2 100 %.    PE: Gen: NAD, Alert and Oriented HEENT:  [place]/AT, EOMI Neck: Supple, no LAD Lungs: CTA Bilaterally CV: RRR without M/G/R ABM: Soft, NTND, +BS Ext: No C/C/E  Assessment/Plan: 1) IDA. 2) Heme positive stool. 3) CAD. 4) COPD. 5) GERD.   There is a suspicion that her bleeding is from an upper GI source, however, she does complain about some hematochezia.  Her IDA is progressive and with her personal history of tubular adenomas, a repeat colonoscopy is required.  She admits that she did not follow up as recommended in 2017.  Currently she is hemodynamically stable and there are no complaints of chest pain.   Plan: 1) EGD/Colonoscopy in the AM.  Anniece Bleiler D 12/16/2017, 4:18 PM

## 2017-12-16 NOTE — ED Provider Notes (Signed)
[facility] EMERGENCY DEPARTMENT Provider Note   CSN: [id number] Arrival date & time: 12/16/17  1113     History   Chief Complaint Chief Complaint  Patient presents with  . Abnormal Lab    HPI Autumn Mckinney is a 72 y.o. female.  HPI Patient presents with shortness of breath.  Has had for the better part of the last year.  Last hemoglobin I see in the computer was 10.1 however that was 12 months ago.  States she has had black stool and some blood from her hemorrhoids.  Has some chest tightness.  Decreased amount of exertion states she feels lightheaded when she stands up.  Is supposed to have follow-up with her PCP and had blood drawn for it and found to have a hemoglobin per the patient of 3.  Sent in for further treatment.  States she has felt a tightness in her throat at times.  Also has been losing weight and had a decreased appetite. Past Medical History:  Diagnosis Date  . Anemia   . Angina at rest [facility])   . Atherosclerosis   . CHF (congestive heart failure) ([place])   . COPD (chronic obstructive pulmonary disease) ([place])   . Coronary artery calcification   . Current every day smoker 11/15/2014   Counseled on quitting   . Esophageal reflux   . Gallstones   . GERD (gastroesophageal reflux disease)   . Herniated disc   . History of chest pain   . History of herniated intervertebral disc   . Hot flashes   . Hypercholesterolemia   . Hypertension   . Insomnia   . Lung nodule   . Osteopenia   . Pancreatic cyst   . PNA (pneumonia)   . Tobacco use   . Vaginal dryness     Patient Active Problem List   Diagnosis Date Noted  . Coronary artery disease   . Current every day smoker 11/15/2014    Class: Diagnosis of    Past Surgical History:  Procedure Laterality Date  . CHOLECYSTECTOMY    . LEFT HEART CATH AND CORONARY ANGIOGRAPHY N/A 02/25/2017   Procedure: LEFT HEART CATH AND CORONARY ANGIOGRAPHY;  Surgeon: Jettie Booze, MD;  Location: [facility];  Service: Cardiovascular;  Laterality: N/A;     OB History   None      Home Medications    Prior to Admission medications   Medication Sig Start Date End Date Taking? Authorizing Provider  amLODipine (NORVASC) 10 MG tablet Take 10 mg by mouth at bedtime.   Yes [provider]  aspirin EC 81 MG tablet Take 81 mg by mouth daily.   Yes [provider]  Calcium Carb-Cholecalciferol 600-400 MG-UNIT TABS Take 1 tablet by mouth daily.   Yes [provider]  celecoxib (CELEBREX) 200 MG capsule Take 200 mg by mouth at bedtime.    Yes [provider]  Chlorpheniramine Maleate (ALLERGY RELIEF PO) Take 1 tablet by mouth daily.    Yes [provider]  doxycycline (VIBRAMYCIN) 100 MG capsule Take 100 mg by mouth 2 (two) times daily. 12/10/17  Yes [provider]  ipratropium (ATROVENT) 0.03 % nasal spray Place 2 sprays into the nose as needed. 12/10/17  Yes [provider]  losartan (COZAAR) 50 MG tablet Take 50 mg by mouth daily.   Yes [provider]  montelukast (SINGULAIR) 10 MG tablet Take 10 mg by mouth daily at 2 PM. 12/13/17  Yes  [provider]  nitroGLYCERIN (NITROSTAT) 0.4 MG SL tablet Place 0.4 mg under the tongue every 5 (five) minutes as needed for chest pain.   Yes [provider]  omeprazole (PRILOSEC) 40 MG capsule Take 40 mg by mouth at bedtime.    Yes [provider]  rosuvastatin (CRESTOR) 5 MG tablet Take 1 tablet (5 mg total) by mouth daily. 05/13/17  Yes Fay Records, MD  topiramate (TOPAMAX) 50 MG tablet Take 50 mg by mouth at bedtime.    Yes [provider]  zolpidem (AMBIEN) 10 MG tablet Take 10 mg by mouth at bedtime.    Yes [provider]    Family History Family History  Problem Relation Age of Onset  . Stroke Mother   . Diabetes Mother   . CVA Mother   . Heart attack Father   . Cancer Sister   . Diabetes Sister   . Arthritis Sister    . CVA Brother   . Lung cancer Brother   . Prostate cancer Brother     Social History Social History   Tobacco Use  . Smoking status: Current Every Day Smoker    Packs/day: 0.20    Years: 55.00    Pack years: 11.00    Types: Cigarettes  . Smokeless tobacco: Never Used  Substance Use Topics  . Alcohol use: No    Alcohol/week: 0.0 standard drinks  . Drug use: Not on file     Allergies   Vicodin [hydrocodone-acetaminophen]   Review of Systems Review of Systems  Constitutional: Positive for fatigue and unexpected weight change. Negative for appetite change.  HENT: Negative for congestion.   Respiratory: Negative for shortness of breath.   Cardiovascular: Negative for chest pain.  Gastrointestinal: Positive for blood in stool. Negative for abdominal pain.  Genitourinary: Negative for flank pain.  Musculoskeletal: Negative for back pain.  Skin: Negative for rash.  Neurological: Positive for weakness and light-headedness.  Psychiatric/Behavioral: Negative for confusion.     Physical Exam Updated Vital Signs BP (!) 148/66 (BP Location: Right Arm)   Pulse 76   Temp 98.4 F (36.9 C) (Oral)   Resp 17   Ht 5\' 1"  (1.549 m)   Wt 63.5 kg   SpO2 100%   BMI 26.45 kg/m   Physical Exam  Constitutional: She appears well-developed.  HENT:  Head: Normocephalic.  Eyes: Pupils are equal, round, and reactive to light.  Neck: Neck supple.  Cardiovascular: Normal rate.  Pulmonary/Chest: She has no wheezes. She has no rales.  Abdominal: There is no tenderness.  Musculoskeletal: She exhibits no tenderness.  Neurological: She is alert.  Skin: Skin is warm. Capillary refill takes less than 2 seconds.     ED Treatments / Results  Labs (all labs ordered are listed, but only abnormal results are displayed) Labs Reviewed  COMPREHENSIVE METABOLIC PANEL - Abnormal; Notable for the following components:      Result Value   CO2 15 (*)    Glucose, Bld 138 (*)    Creatinine, Ser  1.29 (*)    GFR calc non Af Amer 41 (*)    GFR calc Af Amer 47 (*)    All other components within normal limits  CBC - Abnormal; Notable for the following components:   RBC 3.14 (*)    Hemoglobin 5.0 (*)    HCT 20.3 (*)    MCV 64.6 (*)    MCH 15.9 (*)    MCHC 24.6 (*)    RDW  22.1 (*)    Platelets 414 (*)    nRBC 0.3 (*)    All other components within normal limits  IRON AND TIBC  FERRITIN  POC OCCULT BLOOD, ED  TYPE AND SCREEN  ABO/RH    EKG None  Radiology No results found.  Procedures Procedures (including critical care time)  Medications Ordered in ED Medications - No data to display   Initial Impression / Assessment and Plan / ED Course  I have reviewed the triage vital signs and the nursing notes.  Pertinent labs & imaging results that were available during my care of the patient were reviewed by me and considered in my medical decision making (see chart for details).     Patient with symptomatic anemia.  Hemoglobin of 5.  Guaiac positive.  Iron studies sent.  Will transfuse.  Admit to hospitalist.  CRITICAL CARE Performed by: Davonna Belling Total critical care time: 30 minutes Critical care time was exclusive of separately billable procedures and treating other patients. Critical care was necessary to treat or prevent imminent or life-threatening deterioration. Critical care was time spent personally by me on the following activities: development of treatment plan with patient and/or surrogate as well as nursing, discussions with consultants, evaluation of patient's response to treatment, examination of patient, obtaining history from patient or surrogate, ordering and performing treatments and interventions, ordering and review of laboratory studies, ordering and review of radiographic studies, pulse oximetry and re-evaluation of patient's condition.   Final Clinical Impressions(s) / ED Diagnoses   Final diagnoses:  Symptomatic anemia  Gastrointestinal  hemorrhage, unspecified gastrointestinal hemorrhage type    ED Discharge Orders    None       Davonna Belling, MD 12/16/17 1311

## 2017-12-16 NOTE — ED Triage Notes (Addendum)
Pt in reports being seen for yearly physical and was told by PCP to come here d/t low hemoglobin, pt denies pain, reports generalized weakness, pt A&Ox4, reports dark colored stools

## 2017-12-16 NOTE — ED Notes (Signed)
MD at bedside. 

## 2017-12-16 NOTE — Consult Note (Signed)
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  She states that over the past year her she has had problems with SOB, fatigue, and dizziness.  The patient feels that her symptoms started 8 months ago and they progressed.  Earlier in the year, 02/2017, she underwent a left heart cath and she only had mild to moderate CAD.  Her EF was estimated to be in the normal range.  In October she called Dr. Harrington Challenger complaining of SOB and dizziness, but her blood pressure was not hypotensive.  During a routine evaluation with her PCP her HGB was noted to be low and she was instructed to present to the ER.  In the ER she was found to have an HGB of 5.0 with an MCV of 64, which is a drop from her February HGB at 10 g/dL.  IN 2009 her HGB was in the 14 range.  Per her report, she states that she has dark stools and approximately 3-4 months ago she started to have an increase in bowel frequency.  Prior to that time she suffered with constipation.  In fact, when she was followed in the office she was prescribed Amitiza with good effect.  GERD is an issue for her and she did vomit a couple of months ago "dark material".  When she wipes after a bowel movement, she will see red blood.  Past Medical History:  Diagnosis Date  . Anemia   . Angina at rest [facility])   . Atherosclerosis   . CHF (congestive heart failure) ([place])   . COPD (chronic obstructive pulmonary disease) ([place])   . Coronary artery calcification   . Current every day smoker 11/15/2014   Counseled on quitting   . Esophageal reflux   . Gallstones   . GERD (gastroesophageal reflux disease)   . Herniated disc   . History of chest pain   . History of herniated intervertebral disc   . Hot flashes   . Hypercholesterolemia   . Hypertension   . Insomnia   . Lung nodule   . Osteopenia    . Pancreatic cyst   . PNA (pneumonia)   . Tobacco use   . Vaginal dryness     Past Surgical History:  Procedure Laterality Date  . CHOLECYSTECTOMY    . LEFT HEART CATH AND CORONARY ANGIOGRAPHY N/A 02/25/2017   Procedure: LEFT HEART CATH AND CORONARY ANGIOGRAPHY;  Surgeon: Jettie Booze, MD;  Location: [facility];  Service: Cardiovascular;  Laterality: N/A;    Family History  Problem Relation Age of Onset  . Stroke Mother   . Diabetes Mother   . CVA Mother   . Heart attack Father   . Cancer Sister   . Diabetes Sister   . Arthritis Sister   . CVA Brother   . Lung cancer Brother   . Prostate cancer Brother     Social History:  reports that she has been smoking cigarettes. She has a 11.00 pack-year smoking history. She has never used smokeless tobacco. She reports that she does not drink alcohol. Her drug history is not on file.  Allergies:  Allergies  Allergen Reactions  . Vicodin [Hydrocodone-Acetaminophen] Itching    Medications:  Scheduled:  Continuous: . 0.9 % NaCl with KCl 20 mEq / L  Results for orders placed or performed during the hospital encounter of 12/16/17 (from the past 24 hour(s))  Type and screen [place]     Status: None (Preliminary result)   Collection Time: 12/16/17 11:27 AM  Result Value Ref Range   ABO/RH(D) O POS    Antibody Screen NEG    Sample Expiration 12/19/2017    Unit Number [id number]    Blood Component Type RED CELLS,LR    Unit division 00    Status of Unit ISSUED    Transfusion Status OK TO TRANSFUSE    Crossmatch Result      Compatible Performed at [facility], [address] [place], [place] [address]    Unit Number [id number]    Blood Component Type RED CELLS,LR    Unit division 00    Status of Unit ALLOCATED    Transfusion Status OK TO TRANSFUSE    Crossmatch Result Compatible   ABO/Rh     Status: None   Collection Time: 12/16/17 11:27 AM  Result Value Ref Range    ABO/RH(D)      O POS Performed at [facility] [address] [place], [place] [address]   Comprehensive metabolic panel     Status: Abnormal   Collection Time: 12/16/17 11:32 AM  Result Value Ref Range   Sodium 137 135 - 145 mmol/L   Potassium 4.0 3.5 - 5.1 mmol/L   Chloride 109 98 - 111 mmol/L   CO2 15 (L) 22 - 32 mmol/L   Glucose, Bld 138 (H) 70 - 99 mg/dL   BUN 18 8 - 23 mg/dL   Creatinine, Ser 1.29 (H) 0.44 - 1.00 mg/dL   Calcium 9.3 8.9 - 10.3 mg/dL   Total Protein 7.5 6.5 - 8.1 g/dL   Albumin 3.6 3.5 - 5.0 g/dL   AST 24 15 - 41 U/L   ALT 13 0 - 44 U/L   Alkaline Phosphatase 86 38 - 126 U/L   Total Bilirubin 0.3 0.3 - 1.2 mg/dL   GFR calc non Af Amer 41 (L) >60 mL/min   GFR calc Af Amer 47 (L) >60 mL/min   Anion gap 13 5 - 15  CBC     Status: Abnormal   Collection Time: 12/16/17 11:32 AM  Result Value Ref Range   WBC 5.9 4.0 - 10.5 K/uL   RBC 3.14 (L) 3.87 - 5.11 MIL/uL   Hemoglobin 5.0 (LL) 12.0 - 15.0 g/dL   HCT 20.3 (L) 36.0 - 46.0 %   MCV 64.6 (L) 80.0 - 100.0 fL   MCH 15.9 (L) 26.0 - 34.0 pg   MCHC 24.6 (L) 30.0 - 36.0 g/dL   RDW 22.1 (H) 11.5 - 15.5 %   Platelets 414 (H) 150 - 400 K/uL   nRBC 0.3 (H) 0.0 - 0.2 %  Iron and TIBC     Status: Abnormal   Collection Time: 12/16/17  1:06 PM  Result Value Ref Range   Iron 5 (L) 28 - 170 ug/dL   TIBC 386 250 - 450 ug/dL   Saturation Ratios 1 (L) 10.4 - 31.8 %   UIBC 381 ug/dL  Ferritin (Iron Binding Protein)     Status: Abnormal   Collection Time: 12/16/17  1:06 PM  Result Value Ref Range   Ferritin 5 (L) 11 - 307 ng/mL  Prepare RBC     Status: None   Collection Time: 12/16/17  1:18 PM  Result Value Ref Range  Order Confirmation      ORDER PROCESSED BY BLOOD BANK Performed at [facility], [place] [address] [place], [place] [address]   POC occult blood, ED     Status: Abnormal   Collection Time: 12/16/17  2:02 PM  Result Value Ref Range   Fecal Occult Bld POSITIVE (A) NEGATIVE     Dg Chest  2 View  Result Date: 12/16/2017 CLINICAL DATA:  Weakness EXAM: CHEST - 2 VIEW COMPARISON:  11/21/2017 FINDINGS: Cardiac shadow is within normal limits. Aortic calcifications are seen. The lungs are well aerated without focal infiltrate or sizable effusion. No bony abnormality is noted. IMPRESSION: No acute abnormality noted. Electronically Signed   By: Inez Catalina M.D.   On: 12/16/2017 13:56    ROS:  As stated above in the HPI otherwise negative.  Blood pressure (!) 187/108, pulse 61, temperature 98 F (36.7 C), temperature source Oral, resp. rate 17, height 5\' 1"  (1.549 m), weight 63.5 kg, SpO2 100 %.    PE: Gen: NAD, Alert and Oriented HEENT:  [place]/AT, EOMI Neck: Supple, no LAD Lungs: CTA Bilaterally CV: RRR without M/G/R ABM: Soft, NTND, +BS Ext: No C/C/E  Assessment/Plan: 1) IDA. 2) Heme positive stool. 3) CAD. 4) COPD. 5) GERD.   There is a suspicion that her bleeding is from an upper GI source, however, she does complain about some hematochezia.  Her IDA is progressive and with her personal history of tubular adenomas, a repeat colonoscopy is required.  She admits that she did not follow up as recommended in 2017.  Currently she is hemodynamically stable and there are no complaints of chest pain.   Plan: 1) EGD/Colonoscopy in the AM.  Dannon Nguyenthi D 12/16/2017, 4:18 PM

## 2017-12-16 NOTE — H&P (Addendum)
History and Physical    Autumn Mckinney ZSW:[id number] DOB: 13-May-1945 DOA: 12/16/2017  PCP: Velna Hatchet, MD  Patient coming from: Home  I have personally briefly reviewed patient's old medical records in [place]  Chief Complaint: Sent by primary physician due to low hemoglobin  HPI: Autumn Mckinney is a 72 y.o. female with medical history significant of angina at rest, diastolic congestive heart failure, COPD, tobacco use, Hypertension, hypercholesterolemia, patient presents with worsening shortness of breath.  Has had black stool and some blood from her hemorrhoids she believes over the past 6 months.  She has recently developed some chest tightness.  Has had a increase in exertional dyspnea and feels lightheaded when she stands up.  Had blood drawn for a physical examination and was found to be anemic and sent into the emergency department.  At times she feels a tightness in her throat.  She is also had significant weight loss and decrease in her appetite.  She states that over the past 2 weeks she is had a change in her bowel habits stools have become soft she used to have to take a stool softener.  Also states that her hemorrhoids have worsened after being treated with antibiotics for a sinus infection 2 weeks ago.  She has had a GI evaluation in the past by Dr. Benson Norway   ED Course: Found to have a hemoglobin of 5.  2 units of packed red blood cells ordered I was said to evaluate the patient for admission  Review of Systems: As per HPI otherwise all other systems reviewed and  negative.    Past Medical History:  Diagnosis Date  . Anemia   . Angina at rest [facility])   . Atherosclerosis   . CHF (congestive heart failure) ([place])   . COPD (chronic obstructive pulmonary disease) ([place])   . Coronary artery calcification   . Current every day smoker 11/15/2014   Counseled on quitting   . Esophageal reflux   . Gallstones   . GERD (gastroesophageal reflux disease)   . Herniated disc     . History of chest pain   . History of herniated intervertebral disc   . Hot flashes   . Hypercholesterolemia   . Hypertension   . Insomnia   . Lung nodule   . Osteopenia   . Pancreatic cyst   . PNA (pneumonia)   . Tobacco use   . Vaginal dryness     Past Surgical History:  Procedure Laterality Date  . CHOLECYSTECTOMY    . LEFT HEART CATH AND CORONARY ANGIOGRAPHY N/A 02/25/2017   Procedure: LEFT HEART CATH AND CORONARY ANGIOGRAPHY;  Surgeon: Jettie Booze, MD;  Location: [facility] [facility];  Service: Cardiovascular;  Laterality: N/A;    Social History   Social History Narrative  . Not on file     reports that she has been smoking cigarettes. She has a 11.00 pack-year smoking history. She has never used smokeless tobacco. She reports that she does not drink alcohol. Her drug history is not on file.  Allergies  Allergen Reactions  . Vicodin [Hydrocodone-Acetaminophen] Itching    Family History  Problem Relation Age of Onset  . Stroke Mother   . Diabetes Mother   . CVA Mother   . Heart attack Father   . Cancer Sister   . Diabetes Sister   . Arthritis Sister   . CVA Brother   . Lung cancer Brother   . Prostate cancer Brother  Prior to Admission medications   Medication Sig Start Date End Date Taking? Authorizing Provider  amLODipine (NORVASC) 10 MG tablet Take 10 mg by mouth at bedtime.   Yes [provider]  aspirin EC 81 MG tablet Take 81 mg by mouth daily.   Yes [provider]  Calcium Carb-Cholecalciferol 600-400 MG-UNIT TABS Take 1 tablet by mouth daily.   Yes [provider]  celecoxib (CELEBREX) 200 MG capsule Take 200 mg by mouth at bedtime.    Yes [provider]  Chlorpheniramine Maleate (ALLERGY RELIEF PO) Take 1 tablet by mouth daily.    Yes [provider]  doxycycline (VIBRAMYCIN) 100 MG capsule Take 100 mg by mouth 2 (two) times daily. 12/10/17  Yes [provider]  ipratropium  (ATROVENT) 0.03 % nasal spray Place 2 sprays into the nose as needed. 12/10/17  Yes [provider]  losartan (COZAAR) 50 MG tablet Take 50 mg by mouth daily.   Yes [provider]  montelukast (SINGULAIR) 10 MG tablet Take 10 mg by mouth daily at 2 PM. 12/13/17  Yes [provider]  nitroGLYCERIN (NITROSTAT) 0.4 MG SL tablet Place 0.4 mg under the tongue every 5 (five) minutes as needed for chest pain.   Yes [provider]  omeprazole (PRILOSEC) 40 MG capsule Take 40 mg by mouth at bedtime.    Yes [provider]  rosuvastatin (CRESTOR) 5 MG tablet Take 1 tablet (5 mg total) by mouth daily. 05/13/17  Yes Fay Records, MD  topiramate (TOPAMAX) 50 MG tablet Take 50 mg by mouth at bedtime.    Yes [provider]  zolpidem (AMBIEN) 10 MG tablet Take 10 mg by mouth at bedtime.    Yes [provider]    Physical Exam:  Constitutional: Calm appears ill and fatigued Vitals:   12/16/17 1515 12/16/17 1554 12/16/17 1600 12/16/17 1651  BP: (!) 136/59 (!) 187/108 (!) 160/71   Pulse: 61   76  Resp:    20  Temp:   97.9 F (36.6 C) 98.1 F (36.7 C)  TempSrc:   Oral Oral  SpO2: 100%  100%   Weight:      Height:       Eyes: PERRL, lids and conjunctivae normal ENMT: Mucous membranes are moist. Posterior pharynx clear of any exudate or lesions.Normal dentition.  Neck: normal, supple, no masses, no thyromegaly Respiratory: clear to auscultation bilaterally, no wheezing, mild bibasilar crackles. Normal respiratory effort. No accessory muscle use.  Cardiovascular: Regular rate and rhythm, no murmurs / rubs / gallops. No extremity edema. 2+ pedal pulses. No carotid bruits.  Abdomen: no tenderness, no masses palpated. No hepatosplenomegaly. Bowel sounds positive.  Musculoskeletal: no clubbing / cyanosis. No joint deformity upper and lower extremities. Good ROM, no contractures. Normal muscle tone.  Skin: no rashes, lesions, ulcers. No  induration Neurologic: CN 2-12 grossly intact. Sensation intact, DTR normal. Strength 5/5 in all 4.  Psychiatric: Normal judgment and insight. Alert and oriented x 3. Normal mood.    Labs on Admission: I have personally reviewed following labs and imaging studies  CBC: Recent Labs  Lab 12/16/17 1132  WBC 5.9  HGB 5.0*  HCT 20.3*  MCV 64.6*  PLT 938*   Basic Metabolic Panel: Recent Labs  Lab 12/16/17 1132  NA 137  K 4.0  CL 109  CO2 15*  GLUCOSE 138*  BUN 18  CREATININE 1.29*  CALCIUM 9.3   GFR: Estimated Creatinine Clearance: 32.7 mL/min (A) (by  C-G formula based on SCr of 1.29 mg/dL (H)). Liver Function Tests: Recent Labs  Lab 12/16/17 1132  AST 24  ALT 13  ALKPHOS 86  BILITOT 0.3  PROT 7.5  ALBUMIN 3.6   Anemia Panel: Recent Labs    12/16/17 1306  FERRITIN 5*  TIBC 386  IRON 5*   Urine analysis:    Component Value Date/Time   COLORURINE YELLOW 01/02/2007 1233   APPEARANCEUR CLOUDY (A) 01/02/2007 1233   LABSPEC 1.020 01/02/2007 1233   PHURINE 7.5 01/02/2007 1233   GLUCOSEU NEGATIVE 01/02/2007 1233   HGBUR NEGATIVE 01/02/2007 1233   BILIRUBINUR NEGATIVE 01/02/2007 1233   KETONESUR NEGATIVE 01/02/2007 1233   PROTEINUR NEGATIVE 01/02/2007 1233   UROBILINOGEN 1.0 01/02/2007 1233   NITRITE NEGATIVE 01/02/2007 1233   LEUKOCYTESUR  01/02/2007 1233    NEGATIVE MICROSCOPIC NOT DONE ON URINES WITH NEGATIVE PROTEIN, BLOOD, LEUKOCYTES, NITRITE, OR GLUCOSE <1000 mg/dL.    Radiological Exams on Admission: Dg Chest 2 View  Result Date: 12/16/2017 CLINICAL DATA:  Weakness EXAM: CHEST - 2 VIEW COMPARISON:  11/21/2017 FINDINGS: Cardiac shadow is within normal limits. Aortic calcifications are seen. The lungs are well aerated without focal infiltrate or sizable effusion. No bony abnormality is noted. IMPRESSION: No acute abnormality noted. Electronically Signed   By: Inez Catalina M.D.   On: 12/16/2017 13:56    Assessment/Plan Principal Problem:   Acute blood  loss anemia Active Problems:   GI bleeding   Coronary artery disease   Hemorrhoids   Current every day smoker   1.  Acute blood loss anemia: Patient will receive 2 units of packed red blood cells.  Will follow up with her hemoglobin again in the morning.  She does not appear to be having a brisk bleed.  Fairly symptomatic and because of her symptomatic acute blood loss anemia I feel she needs admission into the hospital for close monitoring especially given the fact that her age and history of diastolic congestive heart failure very close antigen and admission to the hospital.  2.  GI bleeding: I have consulted gastroenterology for further evaluation.  This appears to be a combination of both upper bleeding as her stools are dark and lower bleeding as she has had some mixed blood on top of those stools.  She does have a history of hemorrhoids as noted above.  GI has been consulted.  3.  Coronary artery disease: We will continue home medication regimen includes nitroglycerin and losartan.  4.  Hemorrhoids: GI has been consulted will likely need a colonoscopy and these can be evaluated at that time.  5.  Current every day smoker: Patient counseled to quit smoking.     DVT prophylaxis: SCDs Code Status: Full code Family Communication: Spoke with patient's daughter and granddaughter who are present at admission Disposition Plan: Likely home in 2 to 3 days Consults called: [facility] Admission status: Inpatient   Lady Deutscher MD [facility] [place] [facility] Pager [phone]  If 7PM-7AM, please contact night-coverage [email or web address] Password [email or web address]  12/16/2017, 5:01 PM

## 2017-12-17 ENCOUNTER — Encounter (HOSPITAL_COMMUNITY): Payer: Self-pay | Admitting: Internal Medicine

## 2017-12-17 ENCOUNTER — Encounter (HOSPITAL_COMMUNITY)
Admission: EM | Disposition: A | Discharge status: Home / Self Care | Payer: Self-pay | Source: Home / Self Care | Attending: Internal Medicine

## 2017-12-17 DIAGNOSIS — I2510 Atherosclerotic heart disease of native coronary artery without angina pectoris: Secondary | ICD-10-CM

## 2017-12-17 DIAGNOSIS — D500 Iron deficiency anemia secondary to blood loss (chronic): Secondary | ICD-10-CM

## 2017-12-17 DIAGNOSIS — D120 Benign neoplasm of cecum: Secondary | ICD-10-CM

## 2017-12-17 DIAGNOSIS — D122 Benign neoplasm of ascending colon: Secondary | ICD-10-CM

## 2017-12-17 DIAGNOSIS — I2583 Coronary atherosclerosis due to lipid rich plaque: Secondary | ICD-10-CM

## 2017-12-17 DIAGNOSIS — K5730 Diverticulosis of large intestine without perforation or abscess without bleeding: Secondary | ICD-10-CM

## 2017-12-17 DIAGNOSIS — K922 Gastrointestinal hemorrhage, unspecified: Secondary | ICD-10-CM

## 2017-12-17 DIAGNOSIS — D123 Benign neoplasm of transverse colon: Secondary | ICD-10-CM

## 2017-12-17 HISTORY — PX: COLONOSCOPY: SHX5424

## 2017-12-17 HISTORY — PX: ESOPHAGOGASTRODUODENOSCOPY: SHX5428

## 2017-12-17 HISTORY — PX: POLYPECTOMY: SHX5525

## 2017-12-17 LAB — CBC
HCT: 29.9 % — ABNORMAL LOW (ref 36.0–46.0)
Hemoglobin: 8.9 g/dL — ABNORMAL LOW (ref 12.0–15.0)
MCH: 20.6 pg — ABNORMAL LOW (ref 26.0–34.0)
MCHC: 29.8 g/dL — ABNORMAL LOW (ref 30.0–36.0)
MCV: 69.4 fL — ABNORMAL LOW (ref 80.0–100.0)
NRBC: 0.3 % — AB (ref 0.0–0.2)
Platelets: 367 10*3/uL (ref 150–400)
RBC: 4.31 MIL/uL (ref 3.87–5.11)
RDW: 24.3 % — ABNORMAL HIGH (ref 11.5–15.5)
WBC: 7.1 10*3/uL (ref 4.0–10.5)

## 2017-12-17 LAB — VITAMIN B12: Vitamin B-12: 392 pg/mL (ref 180–914)

## 2017-12-17 LAB — BASIC METABOLIC PANEL
Anion gap: 8 (ref 5–15)
BUN: 14 mg/dL (ref 8–23)
CO2: 20 mmol/L — ABNORMAL LOW (ref 22–32)
Calcium: 9.4 mg/dL (ref 8.9–10.3)
Chloride: 114 mmol/L — ABNORMAL HIGH (ref 98–111)
Creatinine, Ser: 1.1 mg/dL — ABNORMAL HIGH (ref 0.44–1.00)
GFR calc Af Amer: 57 mL/min — ABNORMAL LOW (ref >60)
GFR calc non Af Amer: 49 mL/min — ABNORMAL LOW (ref >60)
Glucose, Bld: 101 mg/dL — ABNORMAL HIGH (ref 70–99)
Potassium: 4.4 mmol/L (ref 3.5–5.1)
SODIUM: 142 mmol/L (ref 135–145)

## 2017-12-17 LAB — FOLATE: Folate: 14.4 ng/mL (ref >5.9)

## 2017-12-17 SURGERY — COLONOSCOPY
Anesthesia: Moderate Sedation

## 2017-12-17 MED ORDER — FERROUS SULFATE 325 (65 FE) MG PO TABS
325.0000 mg | ORAL_TABLET | Freq: Two times a day (BID) | ORAL | Status: DC
  Administered 2017-12-17 – 2017-12-20 (×7): 325 mg via ORAL
  Filled 2017-12-17 (×7): qty 1

## 2017-12-17 MED ORDER — MIDAZOLAM HCL 5 MG/5ML IJ SOLN
INTRAMUSCULAR | Status: DC | PRN
  Administered 2017-12-17: 1 mg via INTRAVENOUS
  Administered 2017-12-17 (×2): 2 mg via INTRAVENOUS

## 2017-12-17 MED ORDER — LOSARTAN POTASSIUM 50 MG PO TABS
50.0000 mg | ORAL_TABLET | Freq: Every day | ORAL | Status: DC
  Administered 2017-12-17: 50 mg via ORAL
  Filled 2017-12-17: qty 1

## 2017-12-17 MED ORDER — NITROGLYCERIN 0.4 MG SL SUBL: 0.4000 mg | SUBLINGUAL_TABLET | SUBLINGUAL | Status: DC | PRN

## 2017-12-17 MED ORDER — AMLODIPINE BESYLATE 10 MG PO TABS
10.0000 mg | ORAL_TABLET | Freq: Every day | ORAL | Status: DC
  Administered 2017-12-17 – 2017-12-19 (×3): 10 mg via ORAL
  Filled 2017-12-17 (×3): qty 1

## 2017-12-17 MED ORDER — SODIUM CHLORIDE 0.9 % IV SOLN
INTRAVENOUS | Status: DC
  Administered 2017-12-17: 500 mL via INTRAVENOUS

## 2017-12-17 MED ORDER — TOPIRAMATE 25 MG PO TABS
50.0000 mg | ORAL_TABLET | Freq: Every day | ORAL | Status: DC
  Administered 2017-12-17 – 2017-12-19 (×3): 50 mg via ORAL
  Filled 2017-12-17 (×3): qty 2

## 2017-12-17 MED ORDER — MIDAZOLAM HCL (PF) 5 MG/ML IJ SOLN
INTRAMUSCULAR | Status: AC
  Filled 2017-12-17: qty 2

## 2017-12-17 MED ORDER — FENTANYL CITRATE (PF) 100 MCG/2ML IJ SOLN
INTRAMUSCULAR | Status: DC | PRN
  Administered 2017-12-17 (×3): 25 ug via INTRAVENOUS

## 2017-12-17 MED ORDER — ROSUVASTATIN CALCIUM 5 MG PO TABS
5.0000 mg | ORAL_TABLET | Freq: Every day | ORAL | Status: DC
  Administered 2017-12-17 – 2017-12-20 (×4): 5 mg via ORAL
  Filled 2017-12-17 (×4): qty 1

## 2017-12-17 MED ORDER — FENTANYL CITRATE (PF) 100 MCG/2ML IJ SOLN
INTRAMUSCULAR | Status: AC
  Filled 2017-12-17: qty 2

## 2017-12-17 MED ORDER — PROMETHAZINE HCL 25 MG/ML IJ SOLN
12.5000 mg | Freq: Once | INTRAMUSCULAR | Status: AC
  Administered 2017-12-17: 12.5 mg via INTRAVENOUS
  Filled 2017-12-17: qty 1

## 2017-12-17 NOTE — Op Note (Signed)
[facility] Patient Name: Autumn Mckinney Procedure Date : 12/17/2017 MRN: [id number] Attending MD: Docia Chuck. Henrene Pastor , MD Date of Birth: 03-26-1945 CSN: [id number] Age: 72 Admit Type: Inpatient Procedure:                Upper GI endoscopy Indications:              Iron deficiency anemia Providers:                Docia Chuck. Henrene Pastor, MD, Burtis Junes, RN, Laverda Sorenson,                            Technician Referring MD:             Dr. Carol Ada Medicines:                Monitored Anesthesia Care Complications:            No immediate complications. Estimated Blood Loss:     Estimated blood loss: none. Procedure:                Pre-Anesthesia Assessment:                           - Prior to the procedure, a History and Physical                            was performed, and patient medications and                            allergies were reviewed. The patient's tolerance of                            previous anesthesia was also reviewed. The risks                            and benefits of the procedure and the sedation                            options and risks were discussed with the patient.                            All questions were answered, and informed consent                            was obtained. Prior Anticoagulants: The patient has                            taken no previous anticoagulant or antiplatelet                            agents. ASA Grade Assessment: II - A patient with                            mild systemic disease. After reviewing the risks  and benefits, the patient was deemed in                            satisfactory condition to undergo the procedure.                           After obtaining informed consent, the endoscope was                            passed under direct vision. Throughout the                            procedure, the patient's blood pressure, pulse, and                            oxygen  saturations were monitored continuously. The                            GIF-H190 ([id number] Olympus adult EGD was introduced                            through the mouth, and advanced to the second part                            of duodenum. The upper GI endoscopy was                            accomplished without difficulty. The patient                            tolerated the procedure well. Scope In: Scope Out: Findings:      The esophagus was normal.      The stomach was normal.      The examined duodenum was normal.      The cardia and gastric fundus were normal on retroflexion. Impression:               1. Normal EGD                           2. No cause for iron deficiency anemia found on                            colonoscopy or upper endoscopy Recommendation:           1. Resume previous diet                           2. Iron sulfate 325 mg twice daily                           3. Okay for discharge from GI standpoint as she is                            having no active bleeding or significant pathology  on endoscopic evaluations.                           4. She should follow-up with Dr. Benson Norway as an                            outpatient. She may be a candidate for capsule                            endoscopy to evaluate the small bowel                           5. The findings from her procedures have been                            reviewed with the family. As well they have been                            provided reports. I have also reviewed the                            recommendations as outlined above. We will sign off. Procedure Code(s):        --- Professional ---                           [phone], Esophagogastroduodenoscopy, flexible,                            transoral; diagnostic, including collection of                            specimen(s) by brushing or washing, when performed                            (separate  procedure) Diagnosis Code(s):        --- Professional ---                           D50.9, Iron deficiency anemia, unspecified CPT copyright 2018 American Medical Association. All rights reserved. The codes documented in this report are preliminary and upon coder review may  be revised to meet current compliance requirements. Docia Chuck. Henrene Pastor, MD 12/17/2017 10:55:24 AM This report has been signed electronically. Number of Addenda: 0

## 2017-12-17 NOTE — Progress Notes (Signed)
PROGRESS NOTE                                                                                                                                                                                                             Patient Demographics:    Autumn Mckinney, is a 72 y.o. female, DOB - 11/26/1945, DJM:[id number]  Admit date - 12/16/2017   Admitting Physician Lady Deutscher, MD  Outpatient Primary MD for the patient is Velna Hatchet, MD  LOS - 1   Chief Complaint  Patient presents with  . Abnormal Lab       Brief Narrative    72 y.o. female with medical history significant of angina at rest, diastolic congestive heart failure, COPD, tobacco use, Hypertension, hypercholesterolemia, patient presents with worsening shortness of breath.  Has had black stool and some blood from her hemorrhoids she believes over the past 6 months, work-up was significant for a profound anemia with hemoglobin 5, she was transfused 2 units PRBC with good response hemoglobin 8.9, seen by GI, this post endoscopy/colonoscopy on 12/17/2017 by Dr. Henrene Pastor, with no acute finding to explain blood loss.   Subjective:    Autumn Mckinney today has, No headache, No chest pain, No abdominal pain , reports generalized weakness, fatigue and dyspnea reports she is now back to baseline   Assessment  & Plan :    Principal Problem:   Acute blood loss anemia Active Problems:   Current every day smoker   Coronary artery disease   Hemorrhoids   GI bleeding   Iron deficiency anemia due to chronic blood loss   Benign neoplasm of cecum   Benign neoplasm of ascending colon   Benign neoplasm of transverse colon   Iron deficiency anemia -Quite symptomatic on presentation, she received 2 units PRBC, with overall good response, hemoglobin went up from 5-8.9, her work-up significant for iron deficiency anemia, was extremely depleted iron storage with ferritin of 5. -Check Q22 and folic  acid -We will need IV iron for further supplements, will give  in a.m..  GI bleeding: -Site at this point unspecified, was Hemoccult positive ,may be slow diverticular bleed, currently resolved, but no evidence of active GI bleed on endoscopy or colonoscopy today, hemoglobin dropped again, likely will need capsule endoscopy. -Repeat CBC in a.m.Marland Kitchen  Coronary artery disease: We will continue home  medication regimen includes nitroglycerin and losartan.  Current every day smoker:  - Patient counseled to quit smoking.  Hypertension -Blood pressure started to increase, resume home meds  Hyperlipidemia -resume home Meds      Code Status : Full  Family Communication  : None at bedside  Disposition Plan  : Home when stable  Barriers For Discharge : Symptomatic, with fatigue and shortness of breath, further work-up for her anemia, follow on N39, folic acid level and will receive IV iron in a.m.  Consults  :  GI  Procedures  : Endoscopy/colonoscopy 12/17/2017 by Dr. Henrene Pastor  DVT Prophylaxis  : SCD  Lab Results  Component Value Date   PLT 367 12/17/2017    Antibiotics  :    Anti-infectives (From admission, onward)   None        Objective:   Vitals:   12/17/17 1055 12/17/17 1100 12/17/17 1110 12/17/17 1120  BP: (!) 105/43 (!) 93/42 (!) 137/44 (!) 125/46  Pulse: (!) 55 (!) 53 (!) 52 (!) 55  Resp: 16 16 18 15   Temp:      TempSrc:      SpO2: 95% 96% 94% 95%  Weight:      Height:        Wt Readings from Last 3 Encounters:  12/16/17 63.5 kg  05/20/17 69.3 kg  02/25/17 67.6 kg     Intake/Output Summary (Last 24 hours) at 12/17/2017 1508 Last data filed at 12/16/2017 2043 Gross per 24 hour  Intake 697 ml  Output -  Net 697 ml     Physical Exam  Awake Alert, Oriented X 3, No new F.N deficits, Normal affect, frail Symmetrical Chest wall movement, Good air movement bilaterally, CTAB RRR,No Gallops,Rubs or new Murmurs, No Parasternal Heave +ve B.Sounds, Abd  Soft, No tenderness, No rebound - guarding or rigidity. No Cyanosis, Clubbing or edema, No new Rash or bruise      Data Review:    CBC Recent Labs  Lab 12/16/17 1132 12/17/17 0231  WBC 5.9 7.1  HGB 5.0* 8.9*  HCT 20.3* 29.9*  PLT 414* 367  MCV 64.6* 69.4*  MCH 15.9* 20.6*  MCHC 24.6* 29.8*  RDW 22.1* 24.3*    Chemistries  Recent Labs  Lab 12/16/17 1132 12/17/17 0231  NA 137 142  K 4.0 4.4  CL 109 114*  CO2 15* 20*  GLUCOSE 138* 101*  BUN 18 14  CREATININE 1.29* 1.10*  CALCIUM 9.3 9.4  AST 24  --   ALT 13  --   ALKPHOS 86  --   BILITOT 0.3  --    ------------------------------------------------------------------------------------------------------------------ No results for input(s): CHOL, HDL, LDLCALC, TRIG, CHOLHDL, LDLDIRECT in the last 72 hours.  No results found for: HGBA1C ------------------------------------------------------------------------------------------------------------------ No results for input(s): TSH, T4TOTAL, T3FREE, THYROIDAB in the last 72 hours.  Invalid input(s): FREET3 ------------------------------------------------------------------------------------------------------------------ Recent Labs    12/16/17 1306  FERRITIN 5*  TIBC 386  IRON 5*    Coagulation profile No results for input(s): INR, PROTIME in the last 168 hours.  No results for input(s): DDIMER in the last 72 hours.  Cardiac Enzymes No results for input(s): CKMB, TROPONINI, MYOGLOBIN in the last 168 hours.  Invalid input(s): CK ------------------------------------------------------------------------------------------------------------------ No results found for: BNP  Inpatient Medications  Scheduled Meds: . ferrous sulfate  325 mg Oral BID   Continuous Infusions: . 0.9 % NaCl with KCl 20 mEq / L 125 mL/hr at 12/17/17 0357   PRN Meds:.acetaminophen **OR** acetaminophen, ondansetron **OR** ondansetron (  ZOFRAN) IV, traMADol, traZODone  Micro Results No  results found for this or any previous visit (from the past 240 hour(s)).  Radiology Reports Dg Chest 2 View  Result Date: 12/16/2017 CLINICAL DATA:  Weakness EXAM: CHEST - 2 VIEW COMPARISON:  11/21/2017 FINDINGS: Cardiac shadow is within normal limits. Aortic calcifications are seen. The lungs are well aerated without focal infiltrate or sizable effusion. No bony abnormality is noted. IMPRESSION: No acute abnormality noted. Electronically Signed   By: Inez Catalina M.D.   On: 12/16/2017 13:56   Ct Chest Lung Ca Screen Low Dose W/o Cm  Result Date: 11/21/2017 CLINICAL DATA:  72 year old female with 56 pack-year history of smoking. EXAM: CT CHEST WITHOUT CONTRAST LOW-DOSE FOR LUNG CANCER SCREENING TECHNIQUE: Multidetector CT imaging of the chest was performed following the standard protocol without IV contrast. COMPARISON:  11/17/2016 FINDINGS: Cardiovascular: The heart size is normal. No substantial pericardial effusion. Coronary artery calcification is evident. Atherosclerotic calcification is noted in the wall of the thoracic aorta. Mediastinum/Nodes: No mediastinal lymphadenopathy. No evidence for gross hilar lymphadenopathy although assessment is limited by the lack of intravenous contrast on today's study. The esophagus has normal imaging features. There is no axillary lymphadenopathy. Lungs/Pleura: Centrilobular and paraseptal emphysema noted. The central tracheobronchial airways are patent. Scattered tiny calcified and noncalcified pulmonary nodules identified previously are stable. No new suspicious pulmonary nodule or mass. No pleural effusion. Upper Abdomen: Left upper quadrant nodule medial to the spleen measures 1.7 x 2.9 cm today not substantially changed when comparing back to abdomen/pelvis study from 11/28/2006. This is most likely a splenule. Musculoskeletal: No worrisome lytic or sclerotic osseous abnormality. IMPRESSION: Stable. Lung-RADS 2, benign appearance or behavior. Continue annual  screening with low-dose chest CT without contrast in 12 months. Emphysema (ICD10-J43.9) and aortic Atherosclerois (ICD10-170.0) Electronically Signed   By: Misty Stanley M.D.   On: 11/21/2017 14:39     Phillips Climes M.D on 12/17/2017 at 3:08 PM  Between 7am to 7pm - Pager - [phone]  After 7pm go to [email or web address] - password [facility]  [facility] -  Office  [phone]

## 2017-12-17 NOTE — Interval H&P Note (Signed)
History and Physical Interval Note:  12/17/2017 9:24 AM  Autumn Mckinney  has presented today for surgery, with the diagnosis of IDA and heme positive stool  The various methods of treatment have been discussed with the patient and family. After consideration of risks, benefits and other options for treatment, the patient has consented to  Procedure(s): COLONOSCOPY (N/A) ESOPHAGOGASTRODUODENOSCOPY (EGD) (N/A) as a surgical intervention .  The patient's history has been reviewed, patient examined, no change in status, stable for surgery.  I have reviewed the patient's chart and labs.  Questions were answered to the patient's satisfaction.     Scarlette Shorts

## 2017-12-17 NOTE — Op Note (Signed)
[facility] Patient Name: Autumn Mckinney Procedure Date : 12/17/2017 MRN: [id number] Attending MD: Docia Chuck. Henrene Pastor , MD Date of Birth: 1945-02-13 CSN: [id number] Age: 72 Admit Type: Inpatient Procedure:                Colonoscopy with cold snare polypectomy x 3 Indications:              Iron deficiency anemia. Also history of polyps with                            Dr. Benson Norway 2012 Providers:                Docia Chuck. Henrene Pastor, MD, Burtis Junes, RN, Laverda Sorenson,                            Technician Referring MD:             Carol Ada, MD Medicines:                Monitored Anesthesia Care Complications:            No immediate complications. Estimated blood loss:                            None. Estimated Blood Loss:     Estimated blood loss: none. Procedure:                Pre-Anesthesia Assessment:                           - Prior to the procedure, a History and Physical                            was performed, and patient medications and                            allergies were reviewed. The patient's tolerance of                            previous anesthesia was also reviewed. The risks                            and benefits of the procedure and the sedation                            options and risks were discussed with the patient.                            All questions were answered, and informed consent                            was obtained. Prior Anticoagulants: The patient has                            taken no previous anticoagulant or antiplatelet  agents. ASA Grade Assessment: II - A patient with                            mild systemic disease. After reviewing the risks                            and benefits, the patient was deemed in                            satisfactory condition to undergo the procedure.                           After obtaining informed consent, the colonoscope                            was passed under  direct vision. Throughout the                            procedure, the patient's blood pressure, pulse, and                            oxygen saturations were monitored continuously. The                            Colonoscope was introduced through the anus and                            advanced to the the cecum, identified by                            appendiceal orifice and ileocecal valve. The                            ileocecal valve, appendiceal orifice, and rectum                            were photographed. The quality of the bowel                            preparation was excellent. The colonoscopy was                            performed without difficulty. The patient tolerated                            the procedure well. The bowel preparation used was                            MoviPrep. Scope In: 10:18:51 AM Scope Out: 10:33:41 AM Scope Withdrawal Time: 0 hours 12 minutes 23 seconds  Total Procedure Duration: 0 hours 14 minutes 50 seconds  Findings:      Three polyps were found in the transverse colon, ascending colon and       cecum. The polyps were 2 to 4 mm  in size. These polyps were removed with       a cold snare. Resection and retrieval were complete.      Multiple small and large-mouthed diverticula were found in the entire       colon.      The entire examined colon appeared normal on direct and retroflexion       views. Impression:               - Three 2 to 4 mm polyps in the transverse colon,                            in the ascending colon and in the cecum, removed                            with a cold snare. Resected and retrieved.                           - Diverticulosis in the entire examined colon.                           - The entire examined colon is normal on direct and                            retroflexion views. Recommendation:           - Repeat colonoscopy date to be determined after                            pending pathology results  are reviewed by Dr. Benson Norway                            for surveillance, if any.                           - Patient has a contact number available for                            emergencies. The signs and symptoms of potential                            delayed complications were discussed with the                            patient. Return to normal activities tomorrow.                            Written discharge instructions were provided to the                            patient.                           - Resume previous diet.                           - Continue present  medications.                           - Await pathology results.                           - EGD today. Please see report regarding findings                            and final recommendations Procedure Code(s):        --- Professional ---                           [phone], Colonoscopy, flexible; with removal of                            tumor(s), polyp(s), or other lesion(s) by snare                            technique Diagnosis Code(s):        --- Professional ---                           D12.3, Benign neoplasm of transverse colon (hepatic                            flexure or splenic flexure)                           D12.2, Benign neoplasm of ascending colon                           D12.0, Benign neoplasm of cecum                           D50.9, Iron deficiency anemia, unspecified                           K57.30, Diverticulosis of large intestine without                            perforation or abscess without bleeding CPT copyright 2018 [employer]. All rights reserved. The codes documented in this report are preliminary and upon coder review may  be revised to meet current compliance requirements. Docia Chuck. Henrene Pastor, MD 12/17/2017 10:51:20 AM This report has been signed electronically. Number of Addenda: 0

## 2017-12-18 ENCOUNTER — Other Ambulatory Visit (HOSPITAL_COMMUNITY): Payer: Medicare Other

## 2017-12-18 ENCOUNTER — Inpatient Hospital Stay (HOSPITAL_COMMUNITY): Payer: Medicare Other

## 2017-12-18 DIAGNOSIS — D62 Acute posthemorrhagic anemia: Principal | ICD-10-CM

## 2017-12-18 LAB — TSH: TSH: 1.317 u[IU]/mL (ref 0.350–4.500)

## 2017-12-18 LAB — CBC
HCT: 25.8 % — ABNORMAL LOW (ref 36.0–46.0)
Hemoglobin: 7.4 g/dL — ABNORMAL LOW (ref 12.0–15.0)
MCH: 20.3 pg — ABNORMAL LOW (ref 26.0–34.0)
MCHC: 28.7 g/dL — ABNORMAL LOW (ref 30.0–36.0)
MCV: 70.7 fL — ABNORMAL LOW (ref 80.0–100.0)
Platelets: 309 10*3/uL (ref 150–400)
RBC: 3.65 MIL/uL — ABNORMAL LOW (ref 3.87–5.11)
RDW: 25.5 % — ABNORMAL HIGH (ref 11.5–15.5)
WBC: 7.9 10*3/uL (ref 4.0–10.5)
nRBC: 0.3 % — ABNORMAL HIGH (ref 0.0–0.2)

## 2017-12-18 LAB — BASIC METABOLIC PANEL
Anion gap: 8 (ref 5–15)
BUN: 12 mg/dL (ref 8–23)
CO2: 19 mmol/L — ABNORMAL LOW (ref 22–32)
Calcium: 8.5 mg/dL — ABNORMAL LOW (ref 8.9–10.3)
Chloride: 113 mmol/L — ABNORMAL HIGH (ref 98–111)
Creatinine, Ser: 1.38 mg/dL — ABNORMAL HIGH (ref 0.44–1.00)
GFR calc Af Amer: 44 mL/min — ABNORMAL LOW (ref >60)
GFR calc non Af Amer: 38 mL/min — ABNORMAL LOW (ref >60)
Glucose, Bld: 106 mg/dL — ABNORMAL HIGH (ref 70–99)
Potassium: 3.9 mmol/L (ref 3.5–5.1)
SODIUM: 140 mmol/L (ref 135–145)

## 2017-12-18 LAB — T4, FREE: Free T4: 0.95 ng/dL (ref 0.82–1.77)

## 2017-12-18 LAB — HEMOGLOBIN AND HEMATOCRIT, BLOOD
HCT: 35.9 % — ABNORMAL LOW (ref 36.0–46.0)
HEMOGLOBIN: 10.8 g/dL — AB (ref 12.0–15.0)

## 2017-12-18 LAB — PREPARE RBC (CROSSMATCH)

## 2017-12-18 MED ORDER — LORATADINE 10 MG PO TABS
10.0000 mg | ORAL_TABLET | Freq: Every day | ORAL | Status: DC
  Administered 2017-12-18 – 2017-12-20 (×3): 10 mg via ORAL
  Filled 2017-12-18 (×3): qty 1

## 2017-12-18 MED ORDER — SODIUM CHLORIDE 0.9% IV SOLUTION
Freq: Once | INTRAVENOUS | Status: AC
  Administered 2017-12-18: 08:00:00 via INTRAVENOUS

## 2017-12-18 MED ORDER — SALINE SPRAY 0.65 % NA SOLN
1.0000 | NASAL | Status: DC | PRN
  Administered 2017-12-18 – 2017-12-19 (×3): 1 via NASAL
  Filled 2017-12-18: qty 44

## 2017-12-18 MED ORDER — SODIUM CHLORIDE 0.9 % IV SOLN
510.0000 mg | Freq: Once | INTRAVENOUS | Status: AC
  Administered 2017-12-18: 510 mg via INTRAVENOUS
  Filled 2017-12-18: qty 17

## 2017-12-18 MED ORDER — GUAIFENESIN-DM 100-10 MG/5ML PO SYRP
5.0000 mL | ORAL_SOLUTION | ORAL | Status: DC | PRN
  Administered 2017-12-18 (×2): 5 mL via ORAL
  Filled 2017-12-18 (×2): qty 5

## 2017-12-18 MED ORDER — PHENOL 1.4 % MT LIQD
1.0000 | OROMUCOSAL | Status: DC | PRN
  Administered 2017-12-18: 1 via OROMUCOSAL
  Filled 2017-12-18: qty 177

## 2017-12-18 NOTE — Progress Notes (Signed)
PROGRESS NOTE                                                                                                                                                                                                             Patient Demographics:    Autumn Mckinney, is a 72 y.o. female, DOB - Feb 13, 1945, JGO:[id number]  Admit date - 12/16/2017   Admitting Physician Lady Deutscher, MD  Outpatient Primary MD for the patient is Velna Hatchet, MD  LOS - 2   Chief Complaint  Patient presents with  . Abnormal Lab       Brief Narrative    72 y.o. female with medical history significant of angina at rest, diastolic congestive heart failure, COPD, tobacco use, Hypertension, hypercholesterolemia, patient presents with worsening shortness of breath.  Has had black stool and some blood from her hemorrhoids she believes over the past 6 months, work-up was significant for a profound anemia with hemoglobin 5, she was transfused 2 units PRBC with good response hemoglobin 8.9, seen by GI, this post endoscopy/colonoscopy on 12/17/2017 by Dr. Henrene Pastor, with no acute finding to explain blood loss.  She was transfused another unit PRBC today during hemoglobin is 7.4.   Subjective:    Autumn Mckinney today has, No headache, No chest pain, No abdominal pain , does report some difficulty swallowing .   Assessment  & Plan :    Principal Problem:   Acute blood loss anemia Active Problems:   Current every day smoker   Coronary artery disease   Hemorrhoids   GI bleeding   Iron deficiency anemia due to chronic blood loss   Benign neoplasm of cecum   Benign neoplasm of ascending colon   Benign neoplasm of transverse colon   Iron deficiency anemia -Quite symptomatic on presentation, she received 2 units PRBC, with overall good response, hemoglobin went up from 5-8.9, her work-up significant for iron deficiency anemia, was extremely depleted iron storage with ferritin of 5. -T59  and folic acid within normal limits -She responded well initially to 2 units PRBC transfusion, hemoglobin went from 5 on admission to 8.6 yesterday, hemoglobin down to 7.4 today, I will give another unit PRBC, her ferritin was significantly low at 5, I will give IV Feraheme later today, continue with iron supplement.  GI bleeding: -Site at this point unspecified, was  Hemoccult positive ,may be slow diverticular bleed, currently resolved, but no evidence of active GI bleed on endoscopy or colonoscopy today, hemoglobin is low again at 7.4 today we will discuss with GI if capsule endoscopy is indicated .  AKI -Creatinine went up to 1.38 today, will hold losartan, he should receive some volume with blood transfusion today. -Avoid nephrotoxic medication  Dysphagia -Reports some difficulty swallowing, feeling a knot in her throat, she does have some goiter and physical exam, will obtain ultrasound thyroid  Coronary artery disease: We will continue home medication regimen includes nitroglycerin   Current every day smoker:  - Patient counseled to quit smoking.  Hypertension -Blood pressure started to increase, resume home meds  Hyperlipidemia -resume home Meds     Code Status : Full  Family Communication  : Multiple family members at bedside  Disposition Plan  : Home when stable  Consults  :  GI  Procedures  : Endoscopy/colonoscopy 12/17/2017 by Dr. Henrene Pastor, 2 unit PRBC transfusion 12/16/2017, 1 unit PRBC transfusion 12/18/2017  DVT Prophylaxis  : SCD  Lab Results  Component Value Date   PLT 309 12/18/2017    Antibiotics  :    Anti-infectives (From admission, onward)   None        Objective:   Vitals:   12/18/17 0102 12/18/17 0822 12/18/17 0840 12/18/17 1200  BP: 116/60 (!) 116/50 (!) 135/57 (!) 140/56  Pulse: 73 (!) 51 66 (!) 59  Resp: 16 18 17 18   Temp: 98.6 F (37 C) 98.1 F (36.7 C) 98.2 F (36.8 C) 98.2 F (36.8 C)  TempSrc: Oral Oral Oral Oral  SpO2: 98%  96% 99% 100%  Weight:      Height:        Wt Readings from Last 3 Encounters:  12/16/17 63.5 kg  05/20/17 69.3 kg  02/25/17 67.6 kg     Intake/Output Summary (Last 24 hours) at 12/18/2017 1421 Last data filed at 12/18/2017 1400 Gross per 24 hour  Intake 792 ml  Output -  Net 792 ml     Physical Exam  Awake Alert, Oriented X 3, No new F.N deficits, Normal affect Mildly enlarged left thyroid Symmetrical Chest wall movement, Good air movement bilaterally, CTAB RRR,No Gallops,Rubs or new Murmurs, No Parasternal Heave +ve B.Sounds, Abd Soft, No tenderness, No rebound - guarding or rigidity. No Cyanosis, Clubbing or edema, No new Rash or bruise       Data Review:    CBC Recent Labs  Lab 12/16/17 1132 12/17/17 0231 12/18/17 0314  WBC 5.9 7.1 7.9  HGB 5.0* 8.9* 7.4*  HCT 20.3* 29.9* 25.8*  PLT 414* 367 309  MCV 64.6* 69.4* 70.7*  MCH 15.9* 20.6* 20.3*  MCHC 24.6* 29.8* 28.7*  RDW 22.1* 24.3* 25.5*    Chemistries  Recent Labs  Lab 12/16/17 1132 12/17/17 0231 12/18/17 0314  NA 137 142 140  K 4.0 4.4 3.9  CL 109 114* 113*  CO2 15* 20* 19*  GLUCOSE 138* 101* 106*  BUN 18 14 12   CREATININE 1.29* 1.10* 1.38*  CALCIUM 9.3 9.4 8.5*  AST 24  --   --   ALT 13  --   --   ALKPHOS 86  --   --   BILITOT 0.3  --   --    ------------------------------------------------------------------------------------------------------------------ No results for input(s): CHOL, HDL, LDLCALC, TRIG, CHOLHDL, LDLDIRECT in the last 72 hours.  No results found for: HGBA1C ------------------------------------------------------------------------------------------------------------------ No results for input(s): TSH, T4TOTAL, T3FREE, THYROIDAB in  the last 72 hours.  Invalid input(s): FREET3 ------------------------------------------------------------------------------------------------------------------ Recent Labs    12/16/17 1306 12/17/17 1640  VITAMINB12  --  392  FOLATE  --   14.4  FERRITIN 5*  --   TIBC 386  --   IRON 5*  --     Coagulation profile No results for input(s): INR, PROTIME in the last 168 hours.  No results for input(s): DDIMER in the last 72 hours.  Cardiac Enzymes No results for input(s): CKMB, TROPONINI, MYOGLOBIN in the last 168 hours.  Invalid input(s): CK ------------------------------------------------------------------------------------------------------------------ No results found for: BNP  Inpatient Medications  Scheduled Meds: . amLODipine  10 mg Oral QHS  . ferrous sulfate  325 mg Oral BID  . loratadine  10 mg Oral Daily  . rosuvastatin  5 mg Oral Daily  . topiramate  50 mg Oral QHS   Continuous Infusions: . ferumoxytol     PRN Meds:.acetaminophen **OR** acetaminophen, guaiFENesin-dextromethorphan, nitroGLYCERIN, ondansetron **OR** ondansetron (ZOFRAN) IV, phenol, sodium chloride, traMADol, traZODone  Micro Results No results found for this or any previous visit (from the past 240 hour(s)).  Radiology Reports Dg Chest 2 View  Result Date: 12/16/2017 CLINICAL DATA:  Weakness EXAM: CHEST - 2 VIEW COMPARISON:  11/21/2017 FINDINGS: Cardiac shadow is within normal limits. Aortic calcifications are seen. The lungs are well aerated without focal infiltrate or sizable effusion. No bony abnormality is noted. IMPRESSION: No acute abnormality noted. Electronically Signed   By: Inez Catalina M.D.   On: 12/16/2017 13:56   Ct Chest Lung Ca Screen Low Dose W/o Cm  Result Date: 11/21/2017 CLINICAL DATA:  72 year old female with 56 pack-year history of smoking. EXAM: CT CHEST WITHOUT CONTRAST LOW-DOSE FOR LUNG CANCER SCREENING TECHNIQUE: Multidetector CT imaging of the chest was performed following the standard protocol without IV contrast. COMPARISON:  11/17/2016 FINDINGS: Cardiovascular: The heart size is normal. No substantial pericardial effusion. Coronary artery calcification is evident. Atherosclerotic calcification is noted in  the wall of the thoracic aorta. Mediastinum/Nodes: No mediastinal lymphadenopathy. No evidence for gross hilar lymphadenopathy although assessment is limited by the lack of intravenous contrast on today's study. The esophagus has normal imaging features. There is no axillary lymphadenopathy. Lungs/Pleura: Centrilobular and paraseptal emphysema noted. The central tracheobronchial airways are patent. Scattered tiny calcified and noncalcified pulmonary nodules identified previously are stable. No new suspicious pulmonary nodule or mass. No pleural effusion. Upper Abdomen: Left upper quadrant nodule medial to the spleen measures 1.7 x 2.9 cm today not substantially changed when comparing back to abdomen/pelvis study from 11/28/2006. This is most likely a splenule. Musculoskeletal: No worrisome lytic or sclerotic osseous abnormality. IMPRESSION: Stable. Lung-RADS 2, benign appearance or behavior. Continue annual screening with low-dose chest CT without contrast in 12 months. Emphysema (ICD10-J43.9) and aortic Atherosclerois (ICD10-170.0) Electronically Signed   By: Misty Stanley M.D.   On: 11/21/2017 14:39     Phillips Climes M.D on 12/18/2017 at 2:21 PM  Between 7am to 7pm - Pager - [phone]  After 7pm go to [email or web address] - password [facility]  [facility] -  Office  [phone]

## 2017-12-19 ENCOUNTER — Encounter (HOSPITAL_COMMUNITY)
Admission: EM | Disposition: A | Discharge status: Home / Self Care | Payer: Self-pay | Source: Home / Self Care | Attending: Internal Medicine

## 2017-12-19 ENCOUNTER — Encounter (HOSPITAL_COMMUNITY): Payer: Self-pay | Admitting: Internal Medicine

## 2017-12-19 HISTORY — PX: GIVENS CAPSULE STUDY: SHX5432

## 2017-12-19 LAB — BPAM RBC
Blood Product Expiration Date: 201912062359
Blood Product Expiration Date: 201912062359
Blood Product Expiration Date: 201912292359
ISSUE DATE / TIME: 201912061434
ISSUE DATE / TIME: 201912061759
ISSUE DATE / TIME: 201912080821
UNIT TYPE AND RH: 5100
Unit Type and Rh: 9500
Unit Type and Rh: 9500

## 2017-12-19 LAB — BASIC METABOLIC PANEL
ANION GAP: 7 (ref 5–15)
BUN: 11 mg/dL (ref 8–23)
CO2: 20 mmol/L — AB (ref 22–32)
Calcium: 8.7 mg/dL — ABNORMAL LOW (ref 8.9–10.3)
Chloride: 113 mmol/L — ABNORMAL HIGH (ref 98–111)
Creatinine, Ser: 1.04 mg/dL — ABNORMAL HIGH (ref 0.44–1.00)
GFR calc Af Amer: >60 mL/min (ref >60)
GFR calc non Af Amer: 53 mL/min — ABNORMAL LOW (ref >60)
Glucose, Bld: 110 mg/dL — ABNORMAL HIGH (ref 70–99)
Potassium: 3.7 mmol/L (ref 3.5–5.1)
Sodium: 140 mmol/L (ref 135–145)

## 2017-12-19 LAB — TYPE AND SCREEN
ABO/RH(D): O POS
Antibody Screen: NEGATIVE
UNIT DIVISION: 0
Unit division: 0
Unit division: 0

## 2017-12-19 LAB — CBC
HEMATOCRIT: 32.5 % — AB (ref 36.0–46.0)
HEMOGLOBIN: 9.4 g/dL — AB (ref 12.0–15.0)
MCH: 21.8 pg — ABNORMAL LOW (ref 26.0–34.0)
MCHC: 28.9 g/dL — ABNORMAL LOW (ref 30.0–36.0)
MCV: 75.4 fL — AB (ref 80.0–100.0)
Platelets: 295 10*3/uL (ref 150–400)
RBC: 4.31 MIL/uL (ref 3.87–5.11)
RDW: 27.2 % — ABNORMAL HIGH (ref 11.5–15.5)
WBC: 8.7 10*3/uL (ref 4.0–10.5)
nRBC: 0.2 % (ref 0.0–0.2)

## 2017-12-19 SURGERY — IMAGING PROCEDURE, GI TRACT, INTRALUMINAL, VIA CAPSULE
Anesthesia: LOCAL

## 2017-12-19 MED ORDER — SODIUM CHLORIDE 0.9 % IV SOLN
INTRAVENOUS | Status: DC
  Administered 2017-12-19: 11:00:00 via INTRAVENOUS

## 2017-12-19 MED ORDER — PANTOPRAZOLE SODIUM 40 MG PO TBEC
40.0000 mg | DELAYED_RELEASE_TABLET | Freq: Every day | ORAL | Status: DC
  Administered 2017-12-19 – 2017-12-20 (×2): 40 mg via ORAL
  Filled 2017-12-19 (×2): qty 1

## 2017-12-19 SURGICAL SUPPLY — 1 items: TOWEL COTTON PACK 4EA (MISCELLANEOUS) ×4 IMPLANT

## 2017-12-19 NOTE — Progress Notes (Signed)
PROGRESS NOTE                                                                                                                                                                                                             Patient Demographics:    Autumn Mckinney, is a 72 y.o. female, DOB - [birth year], GEZ:[id number]  Admit date - 12/16/2017   Admitting Physician Lady Deutscher, MD  Outpatient Primary MD for the patient is Velna Hatchet, MD  LOS - 3   Chief Complaint  Patient presents with  . Abnormal Lab       Brief Narrative    72 y.o. female with medical history significant of angina at rest, diastolic congestive heart failure, COPD, tobacco use, Hypertension, hypercholesterolemia, patient presents with worsening shortness of breath.  Has had black stool and some blood from her hemorrhoids she believes over the past 6 months, work-up was significant for a profound anemia with hemoglobin 5, she was transfused 2 units PRBC with good response hemoglobin 8.9, seen by GI, this post endoscopy/colonoscopy on 12/17/2017 by Dr. Henrene Pastor, with no acute finding to explain blood loss.  Going for capsule endoscopy today, so far transfused 2 units PRBC during hospital stay.   Subjective:    Autumn Mckinney today has, No headache, No chest pain, No abdominal pain , she denies any complaints today   Assessment  & Plan :    Principal Problem:   Acute blood loss anemia Active Problems:   Current every day smoker   Coronary artery disease   Hemorrhoids   GI bleeding   Iron deficiency anemia due to chronic blood loss   Benign neoplasm of cecum   Benign neoplasm of ascending colon   Benign neoplasm of transverse colon   Iron deficiency anemia -Quite symptomatic on presentation, she required total of 3 units PRBC transfusion. -  her work-up significant for iron deficiency anemia,  extremely depleted iron storage with ferritin of 5, so she was transfused IV  Feraheme 65/0/3546 -F68 and folic acid within normal limits -10 you with oral iron supplement  GI bleeding: -Site at this point unspecified, was Hemoccult positive ,may be slow diverticular bleed, currently resolved, but no evidence of active GI bleed on endoscopy or colonoscopy , patient capsule endoscopy today.Marland Kitchen  AKI -Creatinine peaked at 1.38, improved with holding losartan, it  is 1.04 today, continue to hold losartan avoid nephrotoxic medication and continue to monitor .  Dysphagia -Reports some difficulty swallowing, feeling a knot in her throat, no findings on the endoscopy, ultrasound thyroid was were obtained showing only very small nodule 0.5 cm, with no recommendation for any further work-up .  Coronary artery disease: We will continue home medication regimen includes nitroglycerin   Current every day smoker:  - Patient counseled to quit smoking.  Hypertension -Elevated this morning, but otherwise acceptable, continue with Norvasc, resume losartan once renal function is stable  Hyperlipidemia -resume home Meds     Code Status : Full  Family Communication  : None at bedside  Disposition Plan  : Home when stable  Consults  :  GI  Procedures  : Endoscopy/colonoscopy 12/17/2017 by Dr. Henrene Pastor, 2 unit PRBC transfusion 12/16/2017, 1 unit PRBC transfusion 12/18/2017  DVT Prophylaxis  : SCD  Lab Results  Component Value Date   PLT 295 12/19/2017    Antibiotics  :    Anti-infectives (From admission, onward)   None        Objective:   Vitals:   12/18/17 1200 12/18/17 1829 12/19/17 0056 12/19/17 0845  BP: (!) 140/56 (!) 158/64 (!) 131/57 (!) 160/58  Pulse: (!) 59 75 (!) 51 (!) 58  Resp: 18 18 14    Temp: 98.2 F (36.8 C) 99 F (37.2 C) 98.2 F (36.8 C) 98.1 F (36.7 C)  TempSrc: Oral Oral Oral Oral  SpO2: 100% 100% 98% 98%  Weight:      Height:        Wt Readings from Last 3 Encounters:  12/16/17 63.5 kg  05/20/17 69.3 kg  02/25/17 67.6 kg      Intake/Output Summary (Last 24 hours) at 12/19/2017 1044 Last data filed at 12/18/2017 1606 Gross per 24 hour  Intake 792 ml  Output -  Net 792 ml     Physical Exam  Awake Alert, Oriented X 3, No new F.N deficits, Normal affect Symmetrical Chest wall movement, Good air movement bilaterally, CTAB RRR,No Gallops,Rubs or new Murmurs, No Parasternal Heave +ve B.Sounds, Abd Soft, No tenderness, No rebound - guarding or rigidity. No Cyanosis, Clubbing or edema, No new Rash or bruise        Data Review:    CBC Recent Labs  Lab 12/16/17 1132 12/17/17 0231 12/18/17 0314 12/18/17 1809 12/19/17 0220  WBC 5.9 7.1 7.9  --  8.7  HGB 5.0* 8.9* 7.4* 10.8* 9.4*  HCT 20.3* 29.9* 25.8* 35.9* 32.5*  PLT 414* 367 309  --  295  MCV 64.6* 69.4* 70.7*  --  75.4*  MCH 15.9* 20.6* 20.3*  --  21.8*  MCHC 24.6* 29.8* 28.7*  --  28.9*  RDW 22.1* 24.3* 25.5*  --  27.2*    Chemistries  Recent Labs  Lab 12/16/17 1132 12/17/17 0231 12/18/17 0314 12/19/17 0220  NA 137 142 140 140  K 4.0 4.4 3.9 3.7  CL 109 114* 113* 113*  CO2 15* 20* 19* 20*  GLUCOSE 138* 101* 106* 110*  BUN 18 14 12 11   CREATININE 1.29* 1.10* 1.38* 1.04*  CALCIUM 9.3 9.4 8.5* 8.7*  AST 24  --   --   --   ALT 13  --   --   --   ALKPHOS 86  --   --   --   BILITOT 0.3  --   --   --    ------------------------------------------------------------------------------------------------------------------ No results for input(s): CHOL, HDL,  LDLCALC, TRIG, CHOLHDL, LDLDIRECT in the last 72 hours.  No results found for: HGBA1C ------------------------------------------------------------------------------------------------------------------ Recent Labs    12/17/17 1640  TSH 1.317   ------------------------------------------------------------------------------------------------------------------ Recent Labs    12/16/17 1306 12/17/17 1640  VITAMINB12  --  392  FOLATE  --  14.4  FERRITIN 5*  --   TIBC 386  --    IRON 5*  --     Coagulation profile No results for input(s): INR, PROTIME in the last 168 hours.  No results for input(s): DDIMER in the last 72 hours.  Cardiac Enzymes No results for input(s): CKMB, TROPONINI, MYOGLOBIN in the last 168 hours.  Invalid input(s): CK ------------------------------------------------------------------------------------------------------------------ No results found for: BNP  Inpatient Medications  Scheduled Meds: . amLODipine  10 mg Oral QHS  . ferrous sulfate  325 mg Oral BID  . loratadine  10 mg Oral Daily  . pantoprazole  40 mg Oral Daily  . rosuvastatin  5 mg Oral Daily  . topiramate  50 mg Oral QHS   Continuous Infusions: . sodium chloride     PRN Meds:.acetaminophen **OR** acetaminophen, guaiFENesin-dextromethorphan, nitroGLYCERIN, ondansetron **OR** ondansetron (ZOFRAN) IV, phenol, sodium chloride, traMADol, traZODone  Micro Results No results found for this or any previous visit (from the past 240 hour(s)).  Radiology Reports Dg Chest 2 View  Result Date: 12/16/2017 CLINICAL DATA:  Weakness EXAM: CHEST - 2 VIEW COMPARISON:  11/21/2017 FINDINGS: Cardiac shadow is within normal limits. Aortic calcifications are seen. The lungs are well aerated without focal infiltrate or sizable effusion. No bony abnormality is noted. IMPRESSION: No acute abnormality noted. Electronically Signed   By: Inez Catalina M.D.   On: 12/16/2017 13:56   Ct Chest Lung Ca Screen Low Dose W/o Cm  Result Date: 11/21/2017 CLINICAL DATA:  72 year old female with 56 pack-year history of smoking. EXAM: CT CHEST WITHOUT CONTRAST LOW-DOSE FOR LUNG CANCER SCREENING TECHNIQUE: Multidetector CT imaging of the chest was performed following the standard protocol without IV contrast. COMPARISON:  11/17/2016 FINDINGS: Cardiovascular: The heart size is normal. No substantial pericardial effusion. Coronary artery calcification is evident. Atherosclerotic calcification is noted in the  wall of the thoracic aorta. Mediastinum/Nodes: No mediastinal lymphadenopathy. No evidence for gross hilar lymphadenopathy although assessment is limited by the lack of intravenous contrast on today's study. The esophagus has normal imaging features. There is no axillary lymphadenopathy. Lungs/Pleura: Centrilobular and paraseptal emphysema noted. The central tracheobronchial airways are patent. Scattered tiny calcified and noncalcified pulmonary nodules identified previously are stable. No new suspicious pulmonary nodule or mass. No pleural effusion. Upper Abdomen: Left upper quadrant nodule medial to the spleen measures 1.7 x 2.9 cm today not substantially changed when comparing back to abdomen/pelvis study from 11/28/2006. This is most likely a splenule. Musculoskeletal: No worrisome lytic or sclerotic osseous abnormality. IMPRESSION: Stable. Lung-RADS 2, benign appearance or behavior. Continue annual screening with low-dose chest CT without contrast in 12 months. Emphysema (ICD10-J43.9) and aortic Atherosclerois (ICD10-170.0) Electronically Signed   By: Misty Stanley M.D.   On: 11/21/2017 14:39   US Thyroid  Result Date: 12/19/2017 CLINICAL DATA:  Palpable abnormality.  Patient feels knot in neck. EXAM: THYROID ULTRASOUND TECHNIQUE: Ultrasound examination of the thyroid gland and adjacent soft tissues was performed. COMPARISON:  None. FINDINGS: Parenchymal Echotexture: Normal Isthmus: 0.4 cm Right lobe: 4.3 x 1.7 x 1.1 cm Left lobe: 4.0 x 1.2 x 1.1 cm _________________________________________________________ Estimated total number of nodules >/= 1 cm: 0 Number of spongiform nodules >/=  2 cm not described  below (TR1): 0 Number of mixed cystic and solid nodules >/= 1.5 cm not described below (TR2): 0 _________________________________________________________ Small nodule in the left lobe measuring 0.5 cm does not meet criteria for biopsy nor follow-up. IMPRESSION: There are no suspicious nodules which meet  criteria for biopsy nor follow-up. The gland is within normal limits in size. The above is in keeping with the ACR TI-RADS recommendations - J Am Coll Radiol 2017;14:587-595. Electronically Signed   By: Marybelle Killings M.D.   On: 12/19/2017 07:33     Phillips Climes M.D on 12/19/2017 at 10:44 AM  Between 7am to 7pm - Pager - [phone]  After 7pm go to [email or web address] - password [facility]  [facility] -  Office  [phone]

## 2017-12-19 NOTE — Care Management Important Message (Signed)
Important Message  Patient Details  Name: Autumn Mckinney MRN: [id number] Date of Birth: [birth year]   Medicare Important Message Given:  Yes    Orbie Pyo 12/19/2017, 4:09 PM

## 2017-12-19 NOTE — Progress Notes (Signed)
Subjective: ? Chest discomfort from GERD.  Objective: Vital signs in last 24 hours: Temp:  [98.1 F (36.7 C)-99 F (37.2 C)] 98.2 F (36.8 C) (12/09 0056) Pulse Rate:  [51-75] 51 (12/09 0056) Resp:  [14-18] 14 (12/09 0056) BP: (116-158)/(50-64) 131/57 (12/09 0056) SpO2:  [96 %-100 %] 98 % (12/09 0056) Last BM Date: 12/18/17  Intake/Output from previous day: 12/08 0701 - 12/09 0700 In: 912 [P.O.:480; Blood:432] Out: -  Intake/Output this shift: No intake/output data recorded.  General appearance: alert and no distress GI: soft, non-tender; bowel sounds normal; no masses,  no organomegaly  Lab Results: Recent Labs    12/17/17 0231 12/18/17 0314 12/18/17 1809 12/19/17 0220  WBC 7.1 7.9  --  8.7  HGB 8.9* 7.4* 10.8* 9.4*  HCT 29.9* 25.8* 35.9* 32.5*  PLT 367 309  --  295   BMET Recent Labs    12/17/17 0231 12/18/17 0314 12/19/17 0220  NA 142 140 140  K 4.4 3.9 3.7  CL 114* 113* 113*  CO2 20* 19* 20*  GLUCOSE 101* 106* 110*  BUN 14 12 11   CREATININE 1.10* 1.38* 1.04*  CALCIUM 9.4 8.5* 8.7*   LFT Recent Labs    12/16/17 1132  PROT 7.5  ALBUMIN 3.6  AST 24  ALT 13  ALKPHOS 86  BILITOT 0.3   PT/INR No results for input(s): LABPROT, INR in the last 72 hours. Hepatitis Panel No results for input(s): HEPBSAG, HCVAB, HEPAIGM, HEPBIGM in the last 72 hours. C-Diff No results for input(s): CDIFFTOX in the last 72 hours. Fecal Lactopherrin No results for input(s): FECLLACTOFRN in the last 72 hours.  Studies/Results: US Thyroid  Result Date: 12/19/2017 CLINICAL DATA:  Palpable abnormality.  Patient feels knot in neck. EXAM: THYROID ULTRASOUND TECHNIQUE: Ultrasound examination of the thyroid gland and adjacent soft tissues was performed. COMPARISON:  None. FINDINGS: Parenchymal Echotexture: Normal Isthmus: 0.4 cm Right lobe: 4.3 x 1.7 x 1.1 cm Left lobe: 4.0 x 1.2 x 1.1 cm _________________________________________________________ Estimated total number of  nodules >/= 1 cm: 0 Number of spongiform nodules >/=  2 cm not described below (TR1): 0 Number of mixed cystic and solid nodules >/= 1.5 cm not described below (TR2): 0 _________________________________________________________ Small nodule in the left lobe measuring 0.5 cm does not meet criteria for biopsy nor follow-up. IMPRESSION: There are no suspicious nodules which meet criteria for biopsy nor follow-up. The gland is within normal limits in size. The above is in keeping with the ACR TI-RADS recommendations - J Am Coll Radiol 2017;14:587-595. Electronically Signed   By: Marybelle Killings M.D.   On: 12/19/2017 07:33    Medications:  Scheduled: . amLODipine  10 mg Oral QHS  . ferrous sulfate  325 mg Oral BID  . loratadine  10 mg Oral Daily  . rosuvastatin  5 mg Oral Daily  . topiramate  50 mg Oral QHS   Continuous:   Assessment/Plan: 1) Severe IDA. 2) GERD.   The patient will undergo a capsule endoscopy today.  The EGD and colonoscopy were normal.  Plan: 1) Capsule endo. 2) Start pantoprazole.  LOS: 3 days   Linzy Darling D 12/19/2017, 7:51 AM

## 2017-12-19 NOTE — Progress Notes (Signed)
Patient swallowed capsule @ 7989 with no difficulties. Instructions reviewed with patient, family and bedside RN. Capsule will be picked up morning of 12.10.19.

## 2017-12-20 LAB — CBC
HCT: 34.4 % — ABNORMAL LOW (ref 36.0–46.0)
Hemoglobin: 10.3 g/dL — ABNORMAL LOW (ref 12.0–15.0)
MCH: 22.5 pg — ABNORMAL LOW (ref 26.0–34.0)
MCHC: 29.9 g/dL — AB (ref 30.0–36.0)
MCV: 75.3 fL — ABNORMAL LOW (ref 80.0–100.0)
Platelets: 313 10*3/uL (ref 150–400)
RBC: 4.57 MIL/uL (ref 3.87–5.11)
WBC: 9.2 10*3/uL (ref 4.0–10.5)
nRBC: 0 % (ref 0.0–0.2)

## 2017-12-20 LAB — BASIC METABOLIC PANEL
Anion gap: 9 (ref 5–15)
BUN: 10 mg/dL (ref 8–23)
CHLORIDE: 111 mmol/L (ref 98–111)
CO2: 21 mmol/L — ABNORMAL LOW (ref 22–32)
CREATININE: 1.13 mg/dL — AB (ref 0.44–1.00)
Calcium: 8.9 mg/dL (ref 8.9–10.3)
GFR calc Af Amer: 55 mL/min — ABNORMAL LOW (ref >60)
GFR calc non Af Amer: 48 mL/min — ABNORMAL LOW (ref >60)
Glucose, Bld: 102 mg/dL — ABNORMAL HIGH (ref 70–99)
Potassium: 3.8 mmol/L (ref 3.5–5.1)
Sodium: 141 mmol/L (ref 135–145)

## 2017-12-20 MED ORDER — FERROUS SULFATE 325 (65 FE) MG PO TABS: 325.0000 mg | ORAL_TABLET | Freq: Two times a day (BID) | ORAL | 1 refills | Status: DC

## 2017-12-20 MED ORDER — POLYETHYLENE GLYCOL 3350 17 G PO PACK
17.0000 g | PACK | Freq: Every day | ORAL | Status: DC
  Administered 2017-12-20: 17 g via ORAL
  Filled 2017-12-20: qty 1

## 2017-12-20 NOTE — Discharge Instructions (Signed)
Follow with Primary MD Velna Hatchet, MD in 7 days   Get CBC, CMP, checked  by Primary MD next visit.    Activity: As tolerated with Full fall precautions use walker/cane & assistance as needed   Disposition Home    Diet: Heart Healthy  , with feeding assistance and aspiration precautions.  For Heart failure patients - Check your Weight same time everyday, if you gain over 2 pounds, or you develop in leg swelling, experience more shortness of breath or chest pain, call your Primary MD immediately. Follow Cardiac Low Salt Diet and 1.5 lit/day fluid restriction.   On your next visit with your primary care physician please Get Medicines reviewed and adjusted.   Please request your Prim.MD to go over all Hospital Tests and Procedure/Radiological results at the follow up, please get all Hospital records sent to your Prim MD by signing hospital release before you go home.   If you experience worsening of your admission symptoms, develop shortness of breath, life threatening emergency, suicidal or homicidal thoughts you must seek medical attention immediately by calling 911 or calling your MD immediately  if symptoms less severe.  You Must read complete instructions/literature along with all the possible adverse reactions/side effects for all the Medicines you take and that have been prescribed to you. Take any new Medicines after you have completely understood and accpet all the possible adverse reactions/side effects.   Do not drive, operating heavy machinery, perform activities at heights, swimming or participation in water activities or provide baby sitting services if your were admitted for syncope or siezures until you have seen by Primary MD or a Neurologist and advised to do so again.  Do not drive when taking Pain medications.    Do not take more than prescribed Pain, Sleep and Anxiety Medications  Special Instructions: If you have smoked or chewed Tobacco  in the last 2 yrs  please stop smoking, stop any regular Alcohol  and or any Recreational drug use.  Wear Seat belts while driving.   Please note  You were cared for by a hospitalist during your hospital stay. If you have any questions about your discharge medications or the care you received while you were in the hospital after you are discharged, you can call the unit and asked to speak with the hospitalist on call if the hospitalist that took care of you is not available. Once you are discharged, your primary care physician will handle any further medical issues. Please note that NO REFILLS for any discharge medications will be authorized once you are discharged, as it is imperative that you return to your primary care physician (or establish a relationship with a primary care physician if you do not have one) for your aftercare needs so that they can reassess your need for medications and monitor your lab values.

## 2017-12-20 NOTE — Discharge Summary (Addendum)
Autumn Mckinney, is a 72 y.o. female  DOB 06/10/1945  MRN [id number].  Admission date:  12/16/2017  Admitting Physician  Lady Deutscher, MD  Discharge Date:  12/20/2017   Primary MD  Velna Hatchet, MD  Recommendations for primary care physician for things to follow:  -Please check CBC, BMP during next visit -Patient still reporting dysphagia, will need ENT referral as an outpatient -Patient has been held on discharge, resume if her capsule endoscopy results are negative, and hemoglobin remained stable during next visit -Avoid NSAIDs, Celebrex has been stopped on discharge   Admission Diagnosis  Symptomatic anemia [D64.9] Gastrointestinal hemorrhage, unspecified gastrointestinal hemorrhage type [K92.2]   Discharge Diagnosis  Symptomatic anemia [D64.9] Gastrointestinal hemorrhage, unspecified gastrointestinal hemorrhage type [K92.2]    Principal Problem:   Acute blood loss anemia Active Problems:   Current every day smoker   Coronary artery disease   Hemorrhoids   GI bleeding   Iron deficiency anemia due to chronic blood loss   Benign neoplasm of cecum   Benign neoplasm of ascending colon   Benign neoplasm of transverse colon      Past Medical History:  Diagnosis Date  . Anemia   . Angina at rest [facility])   . Atherosclerosis   . CHF (congestive heart failure) ([place])   . COPD (chronic obstructive pulmonary disease) ([place])   . Coronary artery calcification   . Current every day smoker 11/15/2014   Counseled on quitting   . Esophageal reflux   . Gallstones   . GERD (gastroesophageal reflux disease)   . Herniated disc   . History of chest pain   . History of herniated intervertebral disc   . Hot flashes   . Hypercholesterolemia   . Hypertension   . Insomnia   . Lung nodule   . Osteopenia   . Pancreatic cyst   . PNA (pneumonia)   . Tobacco use   . Vaginal dryness     Past  Surgical History:  Procedure Laterality Date  . CHOLECYSTECTOMY    . COLONOSCOPY N/A 12/17/2017   Procedure: COLONOSCOPY;  Surgeon: Irene Shipper, MD;  Location: [facility] ENDOSCOPY;  Service: Endoscopy;  Laterality: N/A;  . ESOPHAGOGASTRODUODENOSCOPY N/A 12/17/2017   Procedure: ESOPHAGOGASTRODUODENOSCOPY (EGD);  Surgeon: Irene Shipper, MD;  Location: [facility] ENDOSCOPY;  Service: Endoscopy;  Laterality: N/A;  . GIVENS CAPSULE STUDY N/A 12/19/2017   Procedure: GIVENS CAPSULE STUDY;  Surgeon: Carol Ada, MD;  Location: [place];  Service: Endoscopy;  Laterality: N/A;  . LEFT HEART CATH AND CORONARY ANGIOGRAPHY N/A 02/25/2017   Procedure: LEFT HEART CATH AND CORONARY ANGIOGRAPHY;  Surgeon: Jettie Booze, MD;  Location: [place] CV LAB;  Service: Cardiovascular;  Laterality: N/A;  . POLYPECTOMY  12/17/2017   Procedure: POLYPECTOMY;  Surgeon: Irene Shipper, MD;  Location: [facility] ENDOSCOPY;  Service: Endoscopy;;       History of present illness and  Hospital Course:     Kindly see H&P for history of present illness and admission details, please review  complete Labs, Consult reports and Test reports for all details in brief  HPI  from the history and physical done on the day of admission 12/16/2017   HPI: Autumn Mckinney is a 72 y.o. female with medical history significant of angina at rest, diastolic congestive heart failure, COPD, tobacco use, Hypertension, hypercholesterolemia, patient presents with worsening shortness of breath.  Has had black stool and some blood from her hemorrhoids she believes over the past 6 months.  She has recently developed some chest tightness.  Has had a increase in exertional dyspnea and feels lightheaded when she stands up.  Had blood drawn for a physical examination and was found to be anemic and sent into the emergency department.  At times she feels a tightness in her throat.  She is also had significant weight loss and decrease in her appetite.  She states that over  the past 2 weeks she is had a change in her bowel habits stools have become soft she used to have to take a stool softener.  Also states that her hemorrhoids have worsened after being treated with antibiotics for a sinus infection 2 weeks ago.  She has had a GI evaluation in the past by Dr. Benson Norway   ED Course: Found to have a hemoglobin of 5.  2 units of packed red blood cells ordered I was said to evaluate the patient for admission  Hospital Course   72 y.o.femalewith medical history significant ofangina at rest, diastoliccongestive heart failure, COPD, tobacco use, Hypertension, hypercholesterolemia, patient presents with worsening shortness of breath. Has had black stool and some blood from her hemorrhoids she believes over the past 6 months, work-up was significant for a profound anemia with hemoglobin 5, she was transfused 2 units PRBC with good response hemoglobin 8.9, seen by GI, this post endoscopy/colonoscopy on 12/17/2017 by Dr. Henrene Pastor, with no acute finding to explain blood loss.    Capsule endoscopy done as well .  Iron deficiency anemia -Quite symptomatic on presentation, she required total of 3 units PRBC transfusion. -  her work-up significant for iron deficiency anemia,  extremely depleted iron storage with ferritin of 5, so she was transfused IV Feraheme 86/07/6718 -N47 and folic acid within normal limits - she will be discharged on iron supplements  GI bleeding: -Site at this point unspecified, was Hemoccult positive ,may be slow diverticular bleed, currently resolved, but no evidence of active GI bleed on endoscopy or colonoscopy , patient capsule endoscopy, to follow with  capsule endoscopy results with Dr. Benson Norway as an outpatient.  AKI -Creatinine peaked at 1.38, improved with holding losartan, T1.1 on discharge   Dysphagia -Reports some difficulty swallowing, feeling a knot in her throat, no findings on the endoscopy, ultrasound thyroid was were obtained showing only very  small nodule 0.5 cm, with no recommendation for any further work-up . -She reports this problem is intermittent, continues to be a problem, likely will need ENT referral as an outpatient  Coronary artery disease: We will continue home medication regimen includes nitroglycerin   Current every day smoker:  - Patient counseled to quit smoking.  Hypertension -Resume home meds on discharge  Hyperlipidemia -resume home Meds      Discharge Condition: stable   Follow UP  Follow-up Information    Velna Hatchet, MD Follow up in 1 week(s).   Specialty:  Internal Medicine Contact information: [address] [place] [address] [phone]             Discharge Instructions  and  Discharge Medications    Discharge Instructions    Discharge instructions   Complete by:  As directed    Follow with Primary MD Velna Hatchet, MD in 7 days   Get CBC, CMP, checked  by Primary MD next visit.    Activity: As tolerated with Full fall precautions use walker/cane & assistance as needed   Disposition Home    Diet: Heart Healthy  , with feeding assistance and aspiration precautions.  For Heart failure patients - Check your Weight same time everyday, if you gain over 2 pounds, or you develop in leg swelling, experience more shortness of breath or chest pain, call your Primary MD immediately. Follow Cardiac Low Salt Diet and 1.5 lit/day fluid restriction.   On your next visit with your primary care physician please Get Medicines reviewed and adjusted.   Please request your Prim.MD to go over all Hospital Tests and Procedure/Radiological results at the follow up, please get all Hospital records sent to your Prim MD by signing hospital release before you go home.   If you experience worsening of your admission symptoms, develop shortness of breath, life threatening emergency, suicidal or homicidal thoughts you must seek medical attention immediately by calling 911 or  calling your MD immediately  if symptoms less severe.  You Must read complete instructions/literature along with all the possible adverse reactions/side effects for all the Medicines you take and that have been prescribed to you. Take any new Medicines after you have completely understood and accpet all the possible adverse reactions/side effects.   Do not drive, operating heavy machinery, perform activities at heights, swimming or participation in water activities or provide baby sitting services if your were admitted for syncope or siezures until you have seen by Primary MD or a Neurologist and advised to do so again.  Do not drive when taking Pain medications.    Do not take more than prescribed Pain, Sleep and Anxiety Medications  Special Instructions: If you have smoked or chewed Tobacco  in the last 2 yrs please stop smoking, stop any regular Alcohol  and or any Recreational drug use.  Wear Seat belts while driving.   Please note  You were cared for by a hospitalist during your hospital stay. If you have any questions about your discharge medications or the care you received while you were in the hospital after you are discharged, you can call the unit and asked to speak with the hospitalist on call if the hospitalist that took care of you is not available. Once you are discharged, your primary care physician will handle any further medical issues. Please note that NO REFILLS for any discharge medications will be authorized once you are discharged, as it is imperative that you return to your primary care physician (or establish a relationship with a primary care physician if you do not have one) for your aftercare needs so that they can reassess your need for medications and monitor your lab values.   Increase activity slowly   Complete by:  As directed      Allergies as of 12/20/2017      Reactions   Vicodin [hydrocodone-acetaminophen] Itching      Medication List    STOP taking  these medications   aspirin EC 81 MG tablet   celecoxib 200 MG capsule Commonly known as:  CELEBREX   doxycycline 100 MG capsule Commonly known as:  VIBRAMYCIN     TAKE these medications   amLODipine 10 MG tablet  Commonly known as:  NORVASC Take 10 mg by mouth at bedtime.   Calcium Carb-Cholecalciferol 600-400 MG-UNIT Tabs Take 1 tablet by mouth daily.   ferrous sulfate 325 (65 FE) MG tablet Take 1 tablet (325 mg total) by mouth 2 (two) times daily.   ipratropium 0.03 % nasal spray Commonly known as:  ATROVENT Place 2 sprays into the nose as needed.   losartan 50 MG tablet Commonly known as:  COZAAR Take 50 mg by mouth daily.   montelukast 10 MG tablet Commonly known as:  SINGULAIR Take 10 mg by mouth daily at 2 PM.   nitroGLYCERIN 0.4 MG SL tablet Commonly known as:  NITROSTAT Place 0.4 mg under the tongue every 5 (five) minutes as needed for chest pain.   omeprazole 40 MG capsule Commonly known as:  PRILOSEC Take 40 mg by mouth at bedtime.   rosuvastatin 5 MG tablet Commonly known as:  CRESTOR Take 1 tablet (5 mg total) by mouth daily.   topiramate 50 MG tablet Commonly known as:  TOPAMAX Take 50 mg by mouth at bedtime.   zolpidem 10 MG tablet Commonly known as:  AMBIEN Take 10 mg by mouth at bedtime.         Diet and Activity recommendation: See Discharge Instructions above   Consults obtained -  GI   Major procedures and Radiology Reports - PLEASE review detailed and final reports for all details, in brief -   Colonoscopy, endoscopy, capsule endoscopy   Dg Chest 2 View  Result Date: 12/16/2017 CLINICAL DATA:  Weakness EXAM: CHEST - 2 VIEW COMPARISON:  11/21/2017 FINDINGS: Cardiac shadow is within normal limits. Aortic calcifications are seen. The lungs are well aerated without focal infiltrate or sizable effusion. No bony abnormality is noted. IMPRESSION: No acute abnormality noted. Electronically Signed   By: Inez Catalina M.D.   On:  12/16/2017 13:56   Ct Chest Lung Ca Screen Low Dose W/o Cm  Result Date: 11/21/2017 CLINICAL DATA:  72 year old female with 56 pack-year history of smoking. EXAM: CT CHEST WITHOUT CONTRAST LOW-DOSE FOR LUNG CANCER SCREENING TECHNIQUE: Multidetector CT imaging of the chest was performed following the standard protocol without IV contrast. COMPARISON:  11/17/2016 FINDINGS: Cardiovascular: The heart size is normal. No substantial pericardial effusion. Coronary artery calcification is evident. Atherosclerotic calcification is noted in the wall of the thoracic aorta. Mediastinum/Nodes: No mediastinal lymphadenopathy. No evidence for gross hilar lymphadenopathy although assessment is limited by the lack of intravenous contrast on today's study. The esophagus has normal imaging features. There is no axillary lymphadenopathy. Lungs/Pleura: Centrilobular and paraseptal emphysema noted. The central tracheobronchial airways are patent. Scattered tiny calcified and noncalcified pulmonary nodules identified previously are stable. No new suspicious pulmonary nodule or mass. No pleural effusion. Upper Abdomen: Left upper quadrant nodule medial to the spleen measures 1.7 x 2.9 cm today not substantially changed when comparing back to abdomen/pelvis study from 11/28/2006. This is most likely a splenule. Musculoskeletal: No worrisome lytic or sclerotic osseous abnormality. IMPRESSION: Stable. Lung-RADS 2, benign appearance or behavior. Continue annual screening with low-dose chest CT without contrast in 12 months. Emphysema (ICD10-J43.9) and aortic Atherosclerois (ICD10-170.0) Electronically Signed   By: Misty Stanley M.D.   On: 11/21/2017 14:39   US Thyroid  Result Date: 12/19/2017 CLINICAL DATA:  Palpable abnormality.  Patient feels knot in neck. EXAM: THYROID ULTRASOUND TECHNIQUE: Ultrasound examination of the thyroid gland and adjacent soft tissues was performed. COMPARISON:  None. FINDINGS: Parenchymal Echotexture:  Normal Isthmus: 0.4 cm Right lobe:  4.3 x 1.7 x 1.1 cm Left lobe: 4.0 x 1.2 x 1.1 cm _________________________________________________________ Estimated total number of nodules >/= 1 cm: 0 Number of spongiform nodules >/=  2 cm not described below (TR1): 0 Number of mixed cystic and solid nodules >/= 1.5 cm not described below ([place]): 0 _________________________________________________________ Small nodule in the left lobe measuring 0.5 cm does not meet criteria for biopsy nor follow-up. IMPRESSION: There are no suspicious nodules which meet criteria for biopsy nor follow-up. The gland is within normal limits in size. The above is in keeping with the ACR TI-RADS recommendations - J Am Coll Radiol 2017;14:587-595. Electronically Signed   By: Marybelle Killings M.D.   On: 12/19/2017 07:33    Micro Results     No results found for this or any previous visit (from the past 240 hour(s)).     Today   Subjective:   Autumn Mckinney today has no headache,no chest or  abdominal pain,no new weakness tingling or numbness, feels much better wants to go home today.   Objective:   Blood pressure 140/72, pulse 67, temperature 98 F (36.7 C), temperature source Oral, resp. rate 13, height 5\' 1"  (1.549 m), weight 63.5 kg, SpO2 98 %.   Intake/Output Summary (Last 24 hours) at 12/20/2017 1147 Last data filed at 12/20/2017 0600 Gross per 24 hour  Intake 375.84 ml  Output 200 ml  Net 175.84 ml    Exam Awake Alert, Oriented x 3, No new F.N deficits, Normal affect Symmetrical Chest wall movement, Good air movement bilaterally, CTAB RRR,No Gallops,Rubs or new Murmurs, No Parasternal Heave +ve B.Sounds, Abd Soft, Non tender, No organomegaly appriciated, No rebound -guarding or rigidity. No Cyanosis, Clubbing or edema, No new Rash or bruise  Data Review   CBC w Diff:  Lab Results  Component Value Date   WBC 9.2 12/20/2017   HGB 10.3 (L) 12/20/2017   HGB 10.1 (L) 02/24/2017   HCT 34.4 (L) 12/20/2017   HCT  31.3 (L) 02/24/2017   PLT 313 12/20/2017   PLT 291 02/24/2017   LYMPHOPCT 39 03/07/2007   MONOPCT 9 03/07/2007   EOSPCT 1 03/07/2007   BASOPCT 1 03/07/2007    CMP:  Lab Results  Component Value Date   NA 141 12/20/2017   NA 135 02/24/2017   K 3.8 12/20/2017   CL 111 12/20/2017   CO2 21 (L) 12/20/2017   BUN 10 12/20/2017   BUN 13 02/24/2017   CREATININE 1.13 (H) 12/20/2017   PROT 7.5 12/16/2017   ALBUMIN 3.6 12/16/2017   BILITOT 0.3 12/16/2017   ALKPHOS 86 12/16/2017   AST 24 12/16/2017   ALT 13 12/16/2017  .   Total Time in preparing paper work, data evaluation and todays exam - 65 minutes  Phillips Climes M.D on 12/20/2017 at 11:47 AM  [facility]   Office  [phone]

## 2018-01-20 ENCOUNTER — Ambulatory Visit (INDEPENDENT_AMBULATORY_CARE_PROVIDER_SITE_OTHER): Payer: Medicare Other | Admitting: Internal Medicine

## 2018-01-20 ENCOUNTER — Encounter: Payer: Self-pay | Admitting: Internal Medicine

## 2018-01-20 ENCOUNTER — Encounter (INDEPENDENT_AMBULATORY_CARE_PROVIDER_SITE_OTHER): Payer: Self-pay

## 2018-01-20 ENCOUNTER — Other Ambulatory Visit: Payer: Medicare Other | Admitting: *Deleted

## 2018-01-20 VITALS — BP 130/60 | HR 67 | Ht 61.0 in | Wt 135.1 lb

## 2018-01-20 DIAGNOSIS — D500 Iron deficiency anemia secondary to blood loss (chronic): Secondary | ICD-10-CM | POA: Diagnosis not present

## 2018-01-20 DIAGNOSIS — I10 Essential (primary) hypertension: Secondary | ICD-10-CM | POA: Diagnosis not present

## 2018-01-20 DIAGNOSIS — R252 Cramp and spasm: Secondary | ICD-10-CM | POA: Diagnosis not present

## 2018-01-20 DIAGNOSIS — E785 Hyperlipidemia, unspecified: Secondary | ICD-10-CM

## 2018-01-20 LAB — LIPID PANEL
Chol/HDL Ratio: 2.7 ratio (ref 0.0–4.4)
Cholesterol, Total: 111 mg/dL (ref 100–199)
HDL: 41 mg/dL (ref >39)
LDL Calculated: 52 mg/dL (ref 0–99)
Triglycerides: 91 mg/dL (ref 0–149)
VLDL Cholesterol Cal: 18 mg/dL (ref 5–40)

## 2018-01-20 LAB — URINALYSIS
Bilirubin, UA: NEGATIVE
Glucose, UA: NEGATIVE
Ketones, UA: NEGATIVE
Leukocytes, UA: NEGATIVE
Nitrite, UA: NEGATIVE
PROTEIN UA: NEGATIVE
RBC, UA: NEGATIVE
Specific Gravity, UA: 1.025 (ref 1.005–1.030)
Urobilinogen, Ur: 1 mg/dL (ref 0.2–1.0)
pH, UA: 5.5 (ref 5.0–7.5)

## 2018-01-20 MED ORDER — OMEPRAZOLE 40 MG PO CPDR: 40.0000 mg | DELAYED_RELEASE_CAPSULE | Freq: Every day | ORAL | 3 refills | Status: DC

## 2018-01-20 MED ORDER — FERROUS SULFATE 325 (65 FE) MG PO TABS: 325.0000 mg | ORAL_TABLET | Freq: Every day | ORAL | 1 refills | Status: DC

## 2018-01-20 NOTE — Patient Instructions (Signed)
Medication Instructions:  Your physician has recommended you make the following change in your medication:  1.) stop rosuvastatin (Crestor) 2.) stop pantoprazole (Protonix) 3.) change IRON to once a day 4.) start omeprazole 40 mg once a day   If you need a refill on your cardiac medications before your next appointment, please call your pharmacy.   Lab work: Today: UA, CBC, BMET, CK If you have labs (blood work) drawn today and your tests are completely normal, you will receive your results only by: Marland Kitchen MyChart Message (if you have MyChart) OR . A paper copy in the mail If you have any lab test that is abnormal or we need to change your treatment, we will call you to review the results.  Testing/Procedures: none  Follow-Up: At [facility], you and your health needs are our priority.  As part of our continuing mission to provide you with exceptional heart care, we have created designated Provider Care Teams.  These Care Teams include your primary Cardiologist (physician) and Advanced Practice Providers (APPs -  Physician Assistants and Nurse Practitioners) who all work together to provide you with the care you need, when you need it. You will need a follow up appointment in:  8 months.  Please call our office 2 months in advance to schedule this appointment.  You may see Dorris Carnes, MD or one of the following Advanced Practice Providers on your designated Care Team: Richardson Dopp, PA-C [place], [place] . Daune Perch, NP  Any Other Special Instructions Will Be Listed Below (If Applicable).

## 2018-01-20 NOTE — Progress Notes (Signed)
Cardiology Office Note   Date:  01/20/2018   ID:  Autumn Mckinney, DOB 1945/03/23, MRN [id number]  PCP:  Velna Hatchet, MD  Cardiologist:   Dorris Carnes, MD    PT presents for f/u of CP and CAD    History of Present Illness: Autumn Mckinney is a 73 y.o. female with a history of HTN, GERD,  chest pain, fatigue    Myovue 2012  was normal    Continued pain   Coronary artery CT scan was sugg of signif dz    Went on to have L heart cath   Ths showed only moderate narrowings    In Dec 2019  the pt was found to have a Hgb of 5   Colonoscopy showed tubular adenoma   EGD was neg   She is on Fe    Hbg improving 1 mnth ago    Pt complains of  Cramps in legs   Says started after on Crestor  Dr Janace Hoard (ENT) placed pt on Protonix instead omeprazole   Pt went to him because of   "Knot on throat"  No change  Laryngoscopy was negative    Pt notes some chest pressure  Has had a little in daytime   A litte at night    NOt associated with activity      Current Meds  Medication Sig  . amLODipine (NORVASC) 10 MG tablet Take 10 mg by mouth at bedtime.  . Calcium Carb-Cholecalciferol 600-400 MG-UNIT TABS Take 1 tablet by mouth daily.  . ferrous sulfate 325 (65 FE) MG tablet Take 1 tablet (325 mg total) by mouth 2 (two) times daily.  Marland Kitchen ipratropium (ATROVENT) 0.03 % nasal spray Place 2 sprays into the nose as needed.  Marland Kitchen losartan (COZAAR) 50 MG tablet Take 50 mg by mouth daily.  . montelukast (SINGULAIR) 10 MG tablet Take 10 mg by mouth daily at 2 PM.  . nitroGLYCERIN (NITROSTAT) 0.4 MG SL tablet Place 0.4 mg under the tongue every 5 (five) minutes as needed for chest pain.  . pantoprazole (PROTONIX) 40 MG tablet One pill(40mg ) 30 minutes before breakfast and one pill 30 minutes before dinner.  . rosuvastatin (CRESTOR) 5 MG tablet Take 1 tablet (5 mg total) by mouth daily.  Marland Kitchen topiramate (TOPAMAX) 50 MG tablet Take 50 mg by mouth at bedtime.   Marland Kitchen zolpidem (AMBIEN) 10 MG tablet Take 10 mg by mouth at  bedtime.      Allergies:   Hydrocodone-acetaminophen and Vicodin [hydrocodone-acetaminophen]   Past Medical History:  Diagnosis Date  . Anemia   . Angina at rest [facility])   . Atherosclerosis   . CHF (congestive heart failure) ([place])   . COPD (chronic obstructive pulmonary disease) ([place])   . Coronary artery calcification   . Current every day smoker 11/15/2014   Counseled on quitting   . Esophageal reflux   . Gallstones   . GERD (gastroesophageal reflux disease)   . Herniated disc   . History of chest pain   . History of herniated intervertebral disc   . Hot flashes   . Hypercholesterolemia   . Hypertension   . Insomnia   . Lung nodule   . Osteopenia   . Pancreatic cyst   . PNA (pneumonia)   . Tobacco use   . Vaginal dryness     Past Surgical History:  Procedure Laterality Date  . CHOLECYSTECTOMY    . COLONOSCOPY N/A 12/17/2017   Procedure: COLONOSCOPY;  Surgeon: Scarlette Shorts  N, MD;  Location: [place];  Service: Endoscopy;  Laterality: N/A;  . ESOPHAGOGASTRODUODENOSCOPY N/A 12/17/2017   Procedure: ESOPHAGOGASTRODUODENOSCOPY (EGD);  Surgeon: Irene Shipper, MD;  Location: [facility] ENDOSCOPY;  Service: Endoscopy;  Laterality: N/A;  . GIVENS CAPSULE STUDY N/A 12/19/2017   Procedure: GIVENS CAPSULE STUDY;  Surgeon: Carol Ada, MD;  Location: [place];  Service: Endoscopy;  Laterality: N/A;  . LEFT HEART CATH AND CORONARY ANGIOGRAPHY N/A 02/25/2017   Procedure: LEFT HEART CATH AND CORONARY ANGIOGRAPHY;  Surgeon: Jettie Booze, MD;  Location: [place] CV LAB;  Service: Cardiovascular;  Laterality: N/A;  . POLYPECTOMY  12/17/2017   Procedure: POLYPECTOMY;  Surgeon: Irene Shipper, MD;  Location: [facility] ENDOSCOPY;  Service: Endoscopy;;     Social History:  The patient  reports that she has been smoking cigarettes. She has a 11.00 pack-year smoking history. She has never used smokeless tobacco. She reports that she does not drink alcohol.   Family History:  The patient's  family history includes Arthritis in her sister; CVA in her brother and mother; Cancer in her sister; Diabetes in her mother and sister; Heart attack in her father; Lung cancer in her brother; Prostate cancer in her brother; Stroke in her mother.    ROS:  Please see the history of present illness. All other systems are reviewed and  Negative to the above problem except as noted.    PHYSICAL EXAM: VS:  BP 130/60   Pulse 67   Ht 5\' 1"  (1.549 m)   Wt 135 lb 1.9 oz (61.3 kg)   SpO2 99%   BMI 25.53 kg/m   GEN: Well nourished, well developed, in no acute distress  HEENT: normal  Neck: JVP is not high   No   carotid bruits, or masses Cardiac: RRR; no murmurs, rubs, or gallops,no edema  Respiratory:  clear to auscultation bilaterally, normal work of breathing GI: soft, nontender, nondistended, + BS  No hepatomegaly  MS: no deformity Moving all extremities   Skin: warm and dry, no rash Neuro:  Strength and sensation are intact Psych: euthymic mood, full affect   EKG:  EKG is not ordered today.    Lipid Panel    Component Value Date/Time   CHOL 90 (L) 05/06/2017 0758   TRIG 82 05/06/2017 0758   HDL 43 05/06/2017 0758   CHOLHDL 2.1 05/06/2017 0758   LDLCALC 31 05/06/2017 0758      Wt Readings from Last 3 Encounters:  01/20/18 135 lb 1.9 oz (61.3 kg)  12/16/17 140 lb (63.5 kg)  05/20/17 152 lb 12.8 oz (69.3 kg)      ASSESSMENT AND PLAN:  1  CAD  Moderate by cth   No symptoms to sugg angina    2  Abdominal discomfort Pt found to have tubular adenoma in December  Hgb 5   Tx   On FE    Current symptoms   May be related to Fe   Will check CBC   Back down on Fe to qd for now   Will check CBC, BMET and UA (r/o UTI) Pt seen in ENT for lump in throat   Nothing found   Switched to protonix for possible reflux   Pt noted no change in symtpoms of lump   ALso EGD normal  Does get reflux   Will switch back to omeprazole   Make sure has f/u in ENT       2  HL   On crestor  COmplains  of cramping   Hold crestor   Check CK   Follow response  3   HTN  BP is controlled     Plan for f/u in clinic at the end of the summer      Signed, Dorris Carnes, MD  01/20/2018 8:17 AM    [address], [place], [place]  [address]   ADDENDUM: PT underwent coronary CT angiogram  Calcium scor 845 LM 50 to 75% ostial; LAD 50 -75% ostial   50 to 75% mid LCxLess than 50%  RCA:  50% prox  PLSA 50 to 75%  FFR showed LM normal  RCA prox normal RCA 0.84  PLSA 0.73 normal   LAD 0.88 and distal LAD 0.83  Based on findings would recomm L heart cath tod define anatomy  Risks / benefits described  Pt understands and agrees to proceed.  Dorris Carnes   Pt Phone: [phone]; Fax: [phone]

## 2018-01-21 LAB — BASIC METABOLIC PANEL
BUN/Creatinine Ratio: 12 (ref 12–28)
BUN: 12 mg/dL (ref 8–27)
CO2: 20 mmol/L (ref 20–29)
Calcium: 9.7 mg/dL (ref 8.7–10.3)
Chloride: 107 mmol/L — ABNORMAL HIGH (ref 96–106)
Creatinine, Ser: 0.99 mg/dL (ref 0.57–1.00)
GFR calc Af Amer: 65 mL/min/{1.73_m2} (ref >59)
GFR calc non Af Amer: 56 mL/min/{1.73_m2} — ABNORMAL LOW (ref >59)
Glucose: 99 mg/dL (ref 65–99)
POTASSIUM: 4.3 mmol/L (ref 3.5–5.2)
SODIUM: 140 mmol/L (ref 134–144)

## 2018-01-21 LAB — CBC
Hematocrit: 39.6 % (ref 34.0–46.6)
Hemoglobin: 12.4 g/dL (ref 11.1–15.9)
MCH: 25.4 pg — ABNORMAL LOW (ref 26.6–33.0)
MCHC: 31.3 g/dL — ABNORMAL LOW (ref 31.5–35.7)
MCV: 81 fL (ref 79–97)
Platelets: 163 10*3/uL (ref 150–450)
RBC: 4.88 x10E6/uL (ref 3.77–5.28)
WBC: 8.8 10*3/uL (ref 3.4–10.8)

## 2018-01-21 LAB — CK: Total CK: 64 U/L (ref 24–173)

## 2018-04-04 LAB — IFOBT (OCCULT BLOOD): IFOBT: NEGATIVE

## 2018-07-21 ENCOUNTER — Encounter: Payer: Self-pay | Admitting: Internal Medicine

## 2018-07-24 ENCOUNTER — Telehealth (INDEPENDENT_AMBULATORY_CARE_PROVIDER_SITE_OTHER): Payer: Medicare Other | Admitting: Internal Medicine

## 2018-07-24 ENCOUNTER — Other Ambulatory Visit: Payer: Self-pay

## 2018-07-24 DIAGNOSIS — I10 Essential (primary) hypertension: Secondary | ICD-10-CM | POA: Diagnosis not present

## 2018-07-24 DIAGNOSIS — I2510 Atherosclerotic heart disease of native coronary artery without angina pectoris: Secondary | ICD-10-CM

## 2018-07-24 DIAGNOSIS — E782 Mixed hyperlipidemia: Secondary | ICD-10-CM

## 2018-07-24 NOTE — Patient Instructions (Signed)
Medication Instructions:  No changes If you need a refill on your cardiac medications before your next appointment, please call your pharmacy.   Lab work: none If you have labs (blood work) drawn today and your tests are completely normal, you will receive your results only by: Marland Kitchen MyChart Message (if you have MyChart) OR . A paper copy in the mail If you have any lab test that is abnormal or we need to change your treatment, we will call you to review the results.  Testing/Procedures: none  Follow-Up: At [facility], you and your health needs are our priority.  As part of our continuing mission to provide you with exceptional heart care, we have created designated Provider Care Teams.  These Care Teams include your primary Cardiologist (physician) and Advanced Practice Providers (APPs -  Physician Assistants and Nurse Practitioners) who all work together to provide you with the care you need, when you need it. You will need a follow up appointment in: Feb, 2021-- 7 months.  Please call our office 2 months in advance to schedule this appointment.  You may see Dorris Carnes, MD or one of the following Advanced Practice Providers on your designated Care Team: Richardson Dopp, PA-C [place], [place] . Daune Perch, NP  Any Other Special Instructions Will Be Listed Below (If Applicable). We have requested recent blood work from your PCP.

## 2018-07-24 NOTE — Progress Notes (Signed)
Virtual Visit via Video Note   This visit type was conducted due to national recommendations for restrictions regarding the COVID-19 Pandemic (e.g. social distancing) in an effort to limit this patient's exposure and mitigate transmission in our community.  Due to her co-morbid illnesses, this patient is at least at moderate risk for complications without adequate follow up.  This format is felt to be most appropriate for this patient at this time.  All issues noted in this document were discussed and addressed.  A limited physical exam was performed with this format.  Please refer to the patient's chart for her consent to telehealth for [facility].   Date:  07/24/2018   ID:  Autumn Mckinney, DOB 09/29/1945, MRN [id number]  Patient Location: Home Provider Location: Home  PCP:  Velna Hatchet, MD  Cardiologist:  Dorris Carnes, MD  Electrophysiologist:  None   Evaluation Performed:  Follow-Up Visit  Chief Complaint:  F/U of CAD  History of Present Illness:    Autumn Mckinney is a 73 y.o. female with a history of HTN, GERD, chest pain   Myovue normal in past.  CT in 2019 showed significant coronary calcfications   L hear cath showed moderate stenoses    SHe was last in cardiology clinic in May 2019    PT says she is doing good  No CP   No edema  No SOB  No dizziness  The patient does not have symptoms concerning for COVID-19 infection (fever, chills, cough, or new shortness of breath).    Past Medical History:  Diagnosis Date  . Anemia   . Angina at rest [facility])   . Atherosclerosis   . CHF (congestive heart failure) ([place])   . COPD (chronic obstructive pulmonary disease) ([place])   . Coronary artery calcification   . Current every day smoker 11/15/2014   Counseled on quitting   . Esophageal reflux   . Gallstones   . GERD (gastroesophageal reflux disease)   . Herniated disc   . History of chest pain   . History of herniated intervertebral disc   . Hot flashes   .  Hypercholesterolemia   . Hypertension   . Insomnia   . Lung nodule   . Osteopenia   . Pancreatic cyst   . PNA (pneumonia)   . Tobacco use   . Vaginal dryness    Past Surgical History:  Procedure Laterality Date  . CHOLECYSTECTOMY    . COLONOSCOPY N/A 12/17/2017   Procedure: COLONOSCOPY;  Surgeon: Irene Shipper, MD;  Location: [facility] ENDOSCOPY;  Service: Endoscopy;  Laterality: N/A;  . ESOPHAGOGASTRODUODENOSCOPY N/A 12/17/2017   Procedure: ESOPHAGOGASTRODUODENOSCOPY (EGD);  Surgeon: Irene Shipper, MD;  Location: [facility] ENDOSCOPY;  Service: Endoscopy;  Laterality: N/A;  . GIVENS CAPSULE STUDY N/A 12/19/2017   Procedure: GIVENS CAPSULE STUDY;  Surgeon: Carol Ada, MD;  Location: [place];  Service: Endoscopy;  Laterality: N/A;  . LEFT HEART CATH AND CORONARY ANGIOGRAPHY N/A 02/25/2017   Procedure: LEFT HEART CATH AND CORONARY ANGIOGRAPHY;  Surgeon: Jettie Booze, MD;  Location: [place] CV LAB;  Service: Cardiovascular;  Laterality: N/A;  . POLYPECTOMY  12/17/2017   Procedure: POLYPECTOMY;  Surgeon: Irene Shipper, MD;  Location[facility] ENDOSCOPY;  Service: Endoscopy;;     Current Meds  Medication Sig  . amLODipine (NORVASC) 10 MG tablet Take 10 mg by mouth at bedtime.  . Calcium Carb-Cholecalciferol 600-400 MG-UNIT TABS Take 1 tablet by mouth daily.  . ferrous sulfate 325 (65  FE) MG tablet Take 1 tablet (325 mg total) by mouth daily with breakfast. (Patient taking differently: Take 325 mg by mouth daily with breakfast. Take one tablet every Mon Wed Fri)  . levocetirizine (XYZAL) 5 MG tablet Take 5 mg by mouth at bedtime.  Marland Kitchen losartan (COZAAR) 25 MG tablet Take 50 mg by mouth daily.  . nitroGLYCERIN (NITROSTAT) 0.4 MG SL tablet Place 0.4 mg under the tongue every 5 (five) minutes as needed for chest pain.  Marland Kitchen omeprazole (PRILOSEC) 40 MG capsule Take 1 capsule (40 mg total) by mouth daily.  Marland Kitchen topiramate (TOPAMAX) 50 MG tablet Take 50 mg by mouth at bedtime.   Marland Kitchen zolpidem (AMBIEN) 10 MG  tablet Take 10 mg by mouth at bedtime.      Allergies:   Hydrocodone-acetaminophen and Vicodin [hydrocodone-acetaminophen]   Social History   Tobacco Use  . Smoking status: Current Every Day Smoker    Packs/day: 0.20    Years: 55.00    Pack years: 11.00    Types: Cigarettes  . Smokeless tobacco: Never Used  Substance Use Topics  . Alcohol use: No    Alcohol/week: 0.0 standard drinks  . Drug use: Never     Family Hx: The patient's family history includes Arthritis in her sister; CVA in her brother and mother; Cancer in her sister; Diabetes in her mother and sister; Heart attack in her father; Lung cancer in her brother; Prostate cancer in her brother; Stroke in her mother.  ROS:   Please see the history of present illness.     All other systems reviewed and are negative.   Prior CV studies:   The following studies were reviewed today:  Coronary CT Feb 2019   Mid Cx lesion is 25% stenosed.  Mid LAD lesion is 50% stenosed.  Ost LAD lesion is 25% stenosed.  Mid RCA lesion is 50% stenosed.  The left ventricular systolic function is normal.  LV end diastolic pressure is normal.  The left ventricular ejection fraction is 55-65% by visual estimate.  There is no aortic valve stenosis.   CAD does not appear severe angiographically.  By CT, the only significant FFR was in the PLA but there was not significant stenosis noted.   Labs/Other Tests and Data Reviewed:    EKG:  No ECG reviewed.  Recent Labs: 12/16/2017: ALT 13 12/17/2017: TSH 1.317 01/20/2018: BUN 12; Creatinine, Ser 0.99; Hemoglobin 12.4; Platelets 163; Potassium 4.3; Sodium 140   Recent Lipid Panel Lab Results  Component Value Date/Time   CHOL 111 01/20/2018 07:57 AM   TRIG 91 01/20/2018 07:57 AM   HDL 41 01/20/2018 07:57 AM   CHOLHDL 2.7 01/20/2018 07:57 AM   LDLCALC 52 01/20/2018 07:57 AM    Wt Readings from Last 3 Encounters:  01/20/18 135 lb 1.9 oz (61.3 kg)  12/16/17 140 lb (63.5 kg)   05/20/17 152 lb 12.8 oz (69.3 kg)     Objective:    Vital Signs:  BP 141/79      ASSESSMENT & PLAN:    1. CAD  No symptoms of angina  2  HL  Lipids in Jan LDL 52  HDL 41  Trig 91   Continue meds      3  HTN   BP is a little higher than optimal  Runs around 140/   WIll review records (appts, labs) from Dr Ardeth Perfect No changes for now  4  GI   Hx GI bleeding   Pt is off ASA because  of hx of bleeding    Will review labs   5  COVID-19 Education: The signs and symptoms of COVID-19 were discussed with the patient and how to seek care for testing (follow up with PCP or arrange E-visit).  The importance of social distancing was discussed today.  Time:   Today, I have spent 20  minutes with the patient with telehealth technology discussing the above problems.     Medication Adjustments/Labs and Tests Ordered: Current medicines are reviewed at length with the patient today.  Concerns regarding medicines are outlined above.   Tests Ordered: No orders of the defined types were placed in this encounter.   Medication Changes: No orders of the defined types were placed in this encounter.   Follow Up:  F/U in Feb 2021  Signed, Dorris Carnes, MD  07/24/2018 8:24 AM    [place]

## 2018-11-27 ENCOUNTER — Ambulatory Visit
Admission: RE | Admit: 2018-11-27 | Discharge: 2018-11-27 | Disposition: A | Discharge status: Home / Self Care | Payer: Medicare Other | Source: Ambulatory Visit | Attending: Acute Care | Admitting: Acute Care

## 2018-11-27 DIAGNOSIS — Z122 Encounter for screening for malignant neoplasm of respiratory organs: Secondary | ICD-10-CM

## 2018-11-27 DIAGNOSIS — F17210 Nicotine dependence, cigarettes, uncomplicated: Secondary | ICD-10-CM

## 2018-12-11 ENCOUNTER — Other Ambulatory Visit: Payer: Self-pay | Admitting: *Deleted

## 2018-12-11 DIAGNOSIS — Z122 Encounter for screening for malignant neoplasm of respiratory organs: Secondary | ICD-10-CM

## 2018-12-11 DIAGNOSIS — F17210 Nicotine dependence, cigarettes, uncomplicated: Secondary | ICD-10-CM

## 2018-12-11 DIAGNOSIS — Z87891 Personal history of nicotine dependence: Secondary | ICD-10-CM

## 2019-01-25 ENCOUNTER — Other Ambulatory Visit: Payer: Self-pay | Admitting: Internal Medicine

## 2019-02-28 ENCOUNTER — Telehealth: Payer: Self-pay | Admitting: *Deleted

## 2019-02-28 NOTE — Telephone Encounter (Signed)
Called patient and moved up her virtual visit to 9:00 am.   Video (Doximity) w Dr. Harrington Challenger. Reviewed medications w her at this time. Consent on file.

## 2019-03-01 ENCOUNTER — Other Ambulatory Visit: Payer: Self-pay

## 2019-03-01 ENCOUNTER — Telehealth (INDEPENDENT_AMBULATORY_CARE_PROVIDER_SITE_OTHER): Payer: Medicare Other | Admitting: Internal Medicine

## 2019-03-01 ENCOUNTER — Encounter: Payer: Self-pay | Admitting: Internal Medicine

## 2019-03-01 DIAGNOSIS — I10 Essential (primary) hypertension: Secondary | ICD-10-CM

## 2019-03-01 DIAGNOSIS — I2510 Atherosclerotic heart disease of native coronary artery without angina pectoris: Secondary | ICD-10-CM | POA: Diagnosis not present

## 2019-03-01 DIAGNOSIS — E782 Mixed hyperlipidemia: Secondary | ICD-10-CM | POA: Diagnosis not present

## 2019-03-01 NOTE — Progress Notes (Signed)
Virtual Visit via Video Note   This visit type was conducted due to national recommendations for restrictions regarding the COVID-19 Pandemic (e.g. social distancing) in an effort to limit this patient's exposure and mitigate transmission in our community.  Due to her co-morbid illnesses, this patient is at least at moderate risk for complications without adequate follow up.  This format is felt to be most appropriate for this patient at this time.  All issues noted in this document were discussed and addressed.  A limited physical exam was performed with this format.  Please refer to the patient's chart for her consent to telehealth for [facility].   Date:  03/01/2019   ID:  Autumn Mckinney, DOB Jul 02, 1945, MRN [id number]  Patient Location: Home Provider Location: Home  PCP:  Velna Hatchet, MD  Cardiologist:  Dorris Carnes, MD  Electrophysiologist:  None   Evaluation Performed:  Follow-Up Visit  Chief Complaint:  F/U of HTN and CAD    History of Present Illness:    Autumn Mckinney is a 74 y.o. female with a hx of CP    CT coronary angiogram done in 2019 that lead to [facility]   L heart cath showed moderate CAD at cath.  Pt also a hx of  HTN, GERD.    I last saw the pt in clinic in [place]  Since seen she denies CP   Breathing is OK   SHe notes no change in her ability to do things  She is having a difficult time getting vaccine for COVID      The patient does not have symptoms concerning for COVID-19 infection (fever, chills, cough, or new shortness of breath).    Past Medical History:  Diagnosis Date  . Anemia   . Angina at rest [facility])   . Atherosclerosis   . CHF (congestive heart failure) ([place])   . COPD (chronic obstructive pulmonary disease) ([place])   . Coronary artery calcification   . Current every day smoker 11/15/2014   Counseled on quitting   . Esophageal reflux   . Gallstones   . GERD (gastroesophageal reflux disease)   . Herniated disc   . History of chest pain    . History of herniated intervertebral disc   . Hot flashes   . Hypercholesterolemia   . Hypertension   . Insomnia   . Lung nodule   . Osteopenia   . Pancreatic cyst   . PNA (pneumonia)   . Tobacco use   . Vaginal dryness    Past Surgical History:  Procedure Laterality Date  . CHOLECYSTECTOMY    . COLONOSCOPY N/A 12/17/2017   Procedure: COLONOSCOPY;  Surgeon: Irene Shipper, MD;  Location: [facility] ENDOSCOPY;  Service: Endoscopy;  Laterality: N/A;  . ESOPHAGOGASTRODUODENOSCOPY N/A 12/17/2017   Procedure: ESOPHAGOGASTRODUODENOSCOPY (EGD);  Surgeon: Irene Shipper, MD;  Location: [facility] ENDOSCOPY;  Service: Endoscopy;  Laterality: N/A;  . GIVENS CAPSULE STUDY N/A 12/19/2017   Procedure: GIVENS CAPSULE STUDY;  Surgeon: Carol Ada, MD;  Location: [place];  Service: Endoscopy;  Laterality: N/A;  . LEFT HEART CATH AND CORONARY ANGIOGRAPHY N/A 02/25/2017   Procedure: LEFT HEART CATH AND CORONARY ANGIOGRAPHY;  Surgeon: Jettie Booze, MD;  Location: [place] CV LAB;  Service: Cardiovascular;  Laterality: N/A;  . POLYPECTOMY  12/17/2017   Procedure: POLYPECTOMY;  Surgeon: Irene Shipper, MD;  Location: [facility] ENDOSCOPY;  Service: Endoscopy;;     Current Meds  Medication Sig  . amLODipine (NORVASC) 10  MG tablet Take 10 mg by mouth at bedtime.  . Ascorbic Acid (VITAMIN C) 1000 MG tablet Take 1,000 mg by mouth daily.  . Calcium Carb-Cholecalciferol 600-400 MG-UNIT TABS Take 1 tablet by mouth daily.  . Cholecalciferol 50 MCG (2000 UT) TABS Take 1 tablet by mouth daily.  Marland Kitchen ipratropium (ATROVENT) 0.03 % nasal spray Place 2 sprays into the nose as needed.  Marland Kitchen levocetirizine (XYZAL) 5 MG tablet Take 5 mg by mouth at bedtime.  Marland Kitchen losartan (COZAAR) 25 MG tablet Take 50 mg by mouth daily.  . nitroGLYCERIN (NITROSTAT) 0.4 MG SL tablet Place 0.4 mg under the tongue every 5 (five) minutes as needed for chest pain.  Marland Kitchen omeprazole (PRILOSEC) 40 MG capsule TAKE 1 CAPSULE (40 MG TOTAL) BY MOUTH DAILY  .  topiramate (TOPAMAX) 50 MG tablet Take 50 mg by mouth at bedtime.   Marland Kitchen zolpidem (AMBIEN) 10 MG tablet Take 10 mg by mouth at bedtime.      Allergies:   Hydrocodone-acetaminophen and Vicodin [hydrocodone-acetaminophen]   Social History   Tobacco Use  . Smoking status: Current Every Day Smoker    Packs/day: 0.20    Years: 55.00    Pack years: 11.00    Types: Cigarettes  . Smokeless tobacco: Never Used  Substance Use Topics  . Alcohol use: No    Alcohol/week: 0.0 standard drinks  . Drug use: Never     Family Hx: The patient's family history includes Arthritis in her sister; CVA in her brother and mother; Cancer in her sister; Diabetes in her mother and sister; Heart attack in her father; Lung cancer in her brother; Prostate cancer in her brother; Stroke in her mother.  ROS:   Please see the history of present illness.     All other systems reviewed and are negative.   Prior CV studies:    L heart cath (Feb 2019)   Mid Cx lesion is 25% stenosed.  Mid LAD lesion is 50% stenosed.  Ost LAD lesion is 25% stenosed.  Mid RCA lesion is 50% stenosed.  The left ventricular systolic function is normal.  LV end diastolic pressure is normal.  The left ventricular ejection fraction is 55-65% by visual estimate.  There is no aortic valve stenosis.   CAD does not appear severe angiographically.  By CT, the only significant FFR was in the PLA but there was not significant stenosis noted.     FFR  Feb 2019FINDINGS: FFR CT FFR CT was normal in LM Proximal RCA and entire circumflex. Values abnormal .84 in mid to distal RCA and .78 in posterior lateral branch. Also  Abnormal in mid LAD.  88 and distal LAD .83  IMPRESSION: FFR CT suggests significant stenosis in PLB and likely mid to distal LAD Suggest f/u catheterization   Electronically Signed   By: Jenkins Rouge M.D.   On: 02/17/2017 10:24   Cardiac CT angiogram FINDINGS: Non-cardiac: See separate report from  [facility]. No significant findings on limited lung and soft tissue windows.  Calcium Score: LM and three vessel calcium noted  Coronary Arteries: Right dominant with no anomalies  LM: 50-75% ostial LM calcific disease  LAD: 50-75% calcific ostial LAD stenosis, 50-75% mixed plaque in proximal and mid LAD  D1: Normal  D2: Normal  D3: Small normal  Circumflex: Less than 50% calcific plaque in mid vessel  OM1: Large branching vessel normal  RCA: Dominant 50% calcific plaque in proximal mid and distal RCA  PDA: Less than 50% calcific  plaque  PLA: 50-75% mixed plaque  IMPRESSION: 1.  Normal aortic root 3.6 cm with aortic atherosclerosis  2.  Calcium score 845 which is 48 th percentile for age and sex  50. Concern for hemodynamically significant LM and ostial/mid LAD stenosis as well as PLB stenosis Study will be sent for FFR CT  Jenkins Rouge    Labs/Other Tests and Data Reviewed:    EKG:  No ECG reviewed.  Recent Labs: No results found for requested labs within last 8760 hours.   Recent Lipid Panel Lab Results  Component Value Date/Time   CHOL 111 01/20/2018 07:57 AM   TRIG 91 01/20/2018 07:57 AM   HDL 41 01/20/2018 07:57 AM   CHOLHDL 2.7 01/20/2018 07:57 AM   LDLCALC 52 01/20/2018 07:57 AM    Wt Readings from Last 3 Encounters:  03/01/19 147 lb 6.4 oz (66.9 kg)  01/20/18 135 lb 1.9 oz (61.3 kg)  12/16/17 140 lb (63.5 kg)     Objective:    Vital Signs:  BP 128/68   Pulse 71   Wt 147 lb 6.4 oz (66.9 kg)   BMI 27.85 kg/m    VITAL SIGNS:  reviewed  ASSESSMENT & PLAN:    1.  CAD   Pt with moderate CAD by cath   She remains asymptomatic   Denies CP   On dyspnea Continue with risk factor modifications     2  HTN  Good control  3  HL  Will try to get labs from Dr Ardeth Perfect  4  COVID-19 Education: The signs and symptoms of COVID-19 were discussed with the patient and how to seek care for testing (follow up with PCP or  arrange E-visit). Pt is trying to get vaccine    Time:   Today, I have spent 20  minutes with the patient with telehealth technology discussing the above problems.     Medication Adjustments/Labs and Tests Ordered: Current medicines are reviewed at length with the patient today.  Concerns regarding medicines are outlined above.   Tests Ordered: No orders of the defined types were placed in this encounter.   Medication Changes: No orders of the defined types were placed in this encounter.   Follow Up:  End of  Novmber 2021 Signed, Dorris Carnes, MD  03/01/2019 9:25 AM    [place]

## 2019-03-01 NOTE — Patient Instructions (Signed)
Medication Instructions:  No changes *If you need a refill on your cardiac medications before your next appointment, please call your pharmacy*  Lab Work: none If you have labs (blood work) drawn today and your tests are completely normal, you will receive your results only by: Marland Kitchen MyChart Message (if you have MyChart) OR . A paper copy in the mail If you have any lab test that is abnormal or we need to change your treatment, we will call you to review the results.  Testing/Procedures: none  Follow-Up: At [facility] ([facility]), you and your health needs are our priority.  As part of our continuing mission to provide you with exceptional heart care, we have created designated Provider Care Teams.  These Care Teams include your primary Cardiologist (physician) and Advanced Practice Providers (APPs -  Physician Assistants and Nurse Practitioners) who all work together to provide you with the care you need, when you need it.  Your next appointment:   9 month(s)  The format for your next appointment:   In Person  Provider:   Dorris Carnes, MD  Other Instructions If any recent labs were done by PCP, we will get a copy for Dr. Harrington Challenger to review.

## 2019-03-03 ENCOUNTER — Ambulatory Visit: Payer: Medicare Other | Attending: Internal Medicine

## 2019-03-03 DIAGNOSIS — Z23 Encounter for immunization: Secondary | ICD-10-CM | POA: Insufficient documentation

## 2019-03-03 NOTE — Progress Notes (Signed)
   Covid-19 Vaccination Clinic  Name:  Autumn Mckinney    MRN: [id number] DOB: 1946-01-02  03/03/2019  Autumn Mckinney was observed post Covid-19 immunization for 15 minutes without incidence. She was provided with Vaccine Information Sheet and instruction to access the V-Safe system.   Autumn Mckinney was instructed to call 911 with any severe reactions post vaccine: Marland Kitchen Difficulty breathing  . Swelling of your face and throat  . A fast heartbeat  . A bad rash all over your body  . Dizziness and weakness    Immunizations Administered    Name Date Dose VIS Date Route   Pfizer COVID-19 Vaccine 03/03/2019  3:40 PM 0.3 mL 12/22/2018 Intramuscular   Manufacturer: [place]   Lot: [id number]   [place]: [id number]

## 2019-03-05 ENCOUNTER — Telehealth: Payer: Self-pay | Admitting: Internal Medicine

## 2019-03-05 NOTE — Telephone Encounter (Signed)
Medical records requested from [facility]. 03/05/19 vlm

## 2019-03-27 ENCOUNTER — Ambulatory Visit: Payer: Medicare Other | Attending: Internal Medicine

## 2019-03-27 DIAGNOSIS — Z23 Encounter for immunization: Secondary | ICD-10-CM

## 2019-03-27 NOTE — Progress Notes (Signed)
   Covid-19 Vaccination Clinic  Name:  Autumn Mckinney    MRN: [id number] DOB: [birth year]  03/27/2019  [name] was observed post Covid-19 immunization for 15 minutes without incident. She was provided with Vaccine Information Sheet and instruction to access the V-Safe system.   [name] was instructed to call 911 with any severe reactions post vaccine: Marland Kitchen Difficulty breathing  . Swelling of face and throat  . A fast heartbeat  . A bad rash all over body  . Dizziness and weakness   Immunizations Administered    Name Date Dose VIS Date Route   Pfizer COVID-19 Vaccine 03/27/2019  2:26 PM 0.3 mL 12/22/2018 Intramuscular   Manufacturer: [place]   Lot: [id number]   [place]: [id number]

## 2019-07-19 ENCOUNTER — Other Ambulatory Visit: Payer: Self-pay | Admitting: Internal Medicine

## 2019-11-26 ENCOUNTER — Encounter: Payer: Self-pay | Admitting: Internal Medicine

## 2019-11-26 ENCOUNTER — Ambulatory Visit (INDEPENDENT_AMBULATORY_CARE_PROVIDER_SITE_OTHER): Payer: Medicare Other | Admitting: Internal Medicine

## 2019-11-26 ENCOUNTER — Other Ambulatory Visit: Payer: Self-pay

## 2019-11-26 VITALS — BP 148/86 | HR 78 | Ht 61.5 in | Wt 142.4 lb

## 2019-11-26 DIAGNOSIS — R2681 Unsteadiness on feet: Secondary | ICD-10-CM

## 2019-11-26 DIAGNOSIS — I2510 Atherosclerotic heart disease of native coronary artery without angina pectoris: Secondary | ICD-10-CM

## 2019-11-26 MED ORDER — ASPIRIN EC 81 MG PO TBEC: 81.0000 mg | DELAYED_RELEASE_TABLET | Freq: Every day | ORAL | 3 refills | Status: DC

## 2019-11-26 MED ORDER — ATORVASTATIN CALCIUM 10 MG PO TABS: 10.0000 mg | ORAL_TABLET | Freq: Every day | ORAL | 3 refills | Status: DC

## 2019-11-26 NOTE — Progress Notes (Signed)
Cardiology Office Note   Date:  11/26/2019   ID:  Autumn Mckinney, DOB 09-07-1945, MRN [id number]  PCP:  Autumn Hatchet, MD  Cardiologist:   Dorris Carnes, MD    PT presents for f/u of CP and CAD    History of Present Illness: Autumn Mckinney is a 74 y.o. female with a history of HTN, GERD,  chest pain, fatigue    Myovue 2012  was normal    Continued pain   Coronary artery CT scan was sugg of signif dz    Went on to have L heart cath   Ths showed only moderate narrowings    In Dec 2019  the pt was found to have a Hgb of 5   Colonoscopy showed tubular adenoma   EGD was neg   I last saw her as a televisiti in Nov 2020.  Since seen, the patient has done okay from a cardiac standpoint.  She says she gets chest discomfort but she also has a known hiatal hernia and thinks it is because of this.  Worse after meals.  She denies chest pain with after activity.  Breathing is okay.  She complains of some weakness in her legs feels unsteady at times.  She uses a cane.  At when I saw her last she had some cramping in her legs.  I recommended stopping Crestor as a trial.  This helped the symptoms they actually went away.  She has not restarted it   Current Meds  Medication Sig  . amLODipine (NORVASC) 10 MG tablet Take 10 mg by mouth at bedtime.  . Ascorbic Acid (VITAMIN C) 1000 MG tablet Take 1,000 mg by mouth daily.  . Calcium Carb-Cholecalciferol 600-400 MG-UNIT TABS Take 1 tablet by mouth daily.  . Cholecalciferol 50 MCG (2000 UT) TABS Take 1 tablet by mouth daily.  Marland Kitchen levocetirizine (XYZAL) 5 MG tablet Take 5 mg by mouth at bedtime.  Marland Kitchen losartan (COZAAR) 25 MG tablet Take 50 mg by mouth daily.  . nitroGLYCERIN (NITROSTAT) 0.4 MG SL tablet Place 0.4 mg under the tongue every 5 (five) minutes as needed for chest pain.  Marland Kitchen omeprazole (PRILOSEC) 40 MG capsule TAKE 1 CAPSULE (40 MG TOTAL) BY MOUTH DAILY  . topiramate (TOPAMAX) 50 MG tablet Take 50 mg by mouth at bedtime.   Marland Kitchen zolpidem (AMBIEN) 10  MG tablet Take 10 mg by mouth at bedtime.      Allergies:   Hydrocodone-acetaminophen and Vicodin [hydrocodone-acetaminophen]   Past Medical History:  Diagnosis Date  . Anemia   . Angina at rest [facility])   . Atherosclerosis   . CHF (congestive heart failure) ([place])   . COPD (chronic obstructive pulmonary disease) ([place])   . Coronary artery calcification   . Current every day smoker 11/15/2014   Counseled on quitting   . Esophageal reflux   . Gallstones   . GERD (gastroesophageal reflux disease)   . Herniated disc   . History of chest pain   . History of herniated intervertebral disc   . Hot flashes   . Hypercholesterolemia   . Hypertension   . Insomnia   . Lung nodule   . Osteopenia   . Pancreatic cyst   . PNA (pneumonia)   . Tobacco use   . Vaginal dryness     Past Surgical History:  Procedure Laterality Date  . CHOLECYSTECTOMY    . COLONOSCOPY N/A 12/17/2017   Procedure: COLONOSCOPY;  Surgeon: Autumn Shipper, MD;  Location: [facility]  ENDOSCOPY;  Service: Endoscopy;  Laterality: N/A;  . ESOPHAGOGASTRODUODENOSCOPY N/A 12/17/2017   Procedure: ESOPHAGOGASTRODUODENOSCOPY (EGD);  Surgeon: Autumn Shipper, MD;  Location: [facility] ENDOSCOPY;  Service: Endoscopy;  Laterality: N/A;  . GIVENS CAPSULE STUDY N/A 12/19/2017   Procedure: GIVENS CAPSULE STUDY;  Surgeon: Autumn Ada, MD;  Location: [place];  Service: Endoscopy;  Laterality: N/A;  . LEFT HEART CATH AND CORONARY ANGIOGRAPHY N/A 02/25/2017   Procedure: LEFT HEART CATH AND CORONARY ANGIOGRAPHY;  Surgeon: Autumn Booze, MD;  Location: [place] CV LAB;  Service: Cardiovascular;  Laterality: N/A;  . POLYPECTOMY  12/17/2017   Procedure: POLYPECTOMY;  Surgeon: Autumn Shipper, MD;  Location[facility] ENDOSCOPY;  Service: Endoscopy;;     Social History:  The patient  reports that she has been smoking cigarettes. She has a 11.00 pack-year smoking history. She has never used smokeless tobacco. She reports that she does not drink alcohol and  does not use drugs.   Family History:  The patient's family history includes Arthritis in her sister; CVA in her brother and mother; Cancer in her sister; Diabetes in her mother and sister; Heart attack in her father; Lung cancer in her brother; Prostate cancer in her brother; Stroke in her mother.    ROS:  Please see the history of present illness. All other systems are reviewed and  Negative to the above problem except as noted.    PHYSICAL EXAM: VS:  BP (!) 148/86   Pulse 78   Ht 5' 1.5" (1.562 m)   Wt 142 lb 6.4 oz (64.6 kg)   BMI 26.47 kg/m   GEN: Well nourished, well developed, in no acute distress  HEENT: normal  Neck: JVP is not elevated   No   carotid bruits,  Cardiac: RRR; no murmurs,,no edema  Respiratory:  clear to auscultation bilaterally, normal work of breathing GI: soft, nontender, nondistended, + BS  No hepatomegaly  MS: no deformity Moving all extremities   Skin: warm and dry, no rash Neuro:  Strength and sensation are intact Psych: euthymic mood, full affect   EKG:  EKG is ordered today.   SR 78   PACs    Lipid Panel    Component Value Date/Time   CHOL 111 01/20/2018 0757   TRIG 91 01/20/2018 0757   HDL 41 01/20/2018 0757   CHOLHDL 2.7 01/20/2018 0757   LDLCALC 52 01/20/2018 0757      Wt Readings from Last 3 Encounters:  11/26/19 142 lb 6.4 oz (64.6 kg)  03/01/19 147 lb 6.4 oz (66.9 kg)  01/20/18 135 lb 1.9 oz (61.3 kg)    Cardiaac Studies   L heart cath (Feb 2019)   Mid Cx lesion is 25% stenosed.  Mid LAD lesion is 50% stenosed.  Ost LAD lesion is 25% stenosed.  Mid RCA lesion is 50% stenosed.  The left ventricular systolic function is normal.  LV end diastolic pressure is normal.  The left ventricular ejection fraction is 55-65% by visual estimate.  There is no aortic valve stenosis.  CAD does not appear severe angiographically. By CT, the only significant FFR was in the PLA but there was not significant stenosis noted.      FFR  Feb 2019FINDINGS: FFR CT FFR CT was normal in LM Proximal RCA and entire circumflex. Values abnormal .84 in mid to distal RCA and .78 in posterior lateral branch. Also  Abnormal in mid LAD. 88 and distal LAD .83  IMPRESSION: FFR CT suggests significant stenosis in PLB  and likely mid to distal LAD Suggest f/u catheterization   Electronically Signed By: Jenkins Rouge M.D. On: 02/17/2017 10:24   Cardiac CT angiogram FINDINGS: Non-cardiac: See separate report from [facility]. No significant findings on limited lung and soft tissue windows.  Calcium Score: LM and three vessel calcium noted  Coronary Arteries: Right dominant with no anomalies  LM: 50-75% ostial LM calcific disease  LAD: 50-75% calcific ostial LAD stenosis, 50-75% mixed plaque in proximal and mid LAD  D1: Normal  D2: Normal  D3: Small normal  Circumflex: Less than 50% calcific plaque in mid vessel  OM1: Large branching vessel normal  RCA: Dominant 50% calcific plaque in proximal mid and distal RCA  PDA: Less than 50% calcific plaque  PLA: 50-75% mixed plaque  IMPRESSION: 1. Normal aortic root 3.6 cm with aortic atherosclerosis  2. Calcium score 845 which is 41 th percentile for age and sex  34. Concern for hemodynamically significant LM and ostial/mid LAD stenosis as well as PLB stenosis Study will be sent for FFR CT  Jenkins Rouge   ASSESSMENT AND PLAN:  1  CAD  Moderate by cath.  She has chest pain but it appears to be more GI, after meals.  Will need to be followed.  Try to maximize risks.  With hx of profound anemia (tubular adenoma) I would not Rx with ASA  2  HL   the patient has been off Crestor.  She is feeling better with no cramping.  She should be on a statin and I would recommend Lipitor 10 mg.  Follow-up lipids in 8 weeks  3   HTN is up today.  She has not taken her meds today  COntinue meds    4.  Lower extremity. She  complains of some weakness On exam today,  her strength appears to be okay I would set her up to be seen in PT to prevent falls  ? More unsteadiness    5.  Tobacco use.  Patient continues to smoke.  How much, depends on the day.  Encouraged her to stop.   Plan for f/u in clinic at the end of the summer      Signed, Dorris Carnes, MD  11/26/2019 8:33 AM    [address], [place], [place]  [id number]   ADDENDUM: PT underwent coronary CT angiogram  Calcium scor 845 LM 50 to 75% ostial; LAD 50 -75% ostial   50 to 75% mid LCxLess than 50%  RCA:  50% prox  PLSA 50 to 75%  FFR showed LM normal  RCA prox normal RCA 0.84  PLSA 0.73 normal   LAD 0.88 and distal LAD 0.83  Based on findings would recomm L heart cath tod define anatomy  Risks / benefits described  Pt understands and agrees to proceed.  Dorris Carnes   Pt Phone: [phone]; Fax: [phone]

## 2019-11-26 NOTE — Progress Notes (Signed)
Aspirin was removed from medication list at this time.  Stevens, RN 11/26/19 1:26 pm.

## 2019-11-26 NOTE — Patient Instructions (Addendum)
Medication Instructions:  Your physician has recommended you make the following change in your medication:  1.) start atorvastatin 10 mg (Lipitor) - one tablet once daily  *If you need a refill on your cardiac medications before your next appointment, please call your pharmacy*   Lab Work: none If you have labs (blood work) drawn today and your tests are completely normal, you will receive your results only by: Marland Kitchen MyChart Message (if you have MyChart) OR . A paper copy in the mail If you have any lab test that is abnormal or we need to change your treatment, we will call you to review the results.   Testing/Procedures: none   Follow-Up: At [facility], you and your health needs are our priority.  As part of our continuing mission to provide you with exceptional heart care, we have created designated Provider Care Teams.  These Care Teams include your primary Cardiologist (physician) and Advanced Practice Providers (APPs -  Physician Assistants and Nurse Practitioners) who all work together to provide you with the care you need, when you need it.  Your next appointment:   9 month(s)  The format for your next appointment:   In Person  Provider:   You may see Dorris Carnes, MD or one of the following Advanced Practice Providers on your designated Care Team:    Richardson Dopp, PA-C  Robbie Lis, [place]    Other Instructions You have been referred to outpatient vestibular (balance) rehab.  They will contact you to arrange your first visit

## 2019-11-26 NOTE — Addendum Note (Signed)
Addended by: Rodman Key on: 11/26/2019 01:26 PM   Modules accepted: Orders

## 2019-11-27 ENCOUNTER — Other Ambulatory Visit: Payer: Self-pay

## 2019-11-27 MED ORDER — OMEPRAZOLE 40 MG PO CPDR: 40.0000 mg | DELAYED_RELEASE_CAPSULE | Freq: Every day | ORAL | 3 refills | Status: AC

## 2019-11-27 MED ORDER — ATORVASTATIN CALCIUM 10 MG PO TABS: 10.0000 mg | ORAL_TABLET | Freq: Every day | ORAL | 3 refills | Status: DC

## 2019-11-28 ENCOUNTER — Ambulatory Visit
Admission: RE | Admit: 2019-11-28 | Discharge: 2019-11-28 | Disposition: A | Discharge status: Home / Self Care | Payer: Medicare Other | Source: Ambulatory Visit | Attending: Acute Care | Admitting: Acute Care

## 2019-11-28 DIAGNOSIS — Z87891 Personal history of nicotine dependence: Secondary | ICD-10-CM

## 2019-11-28 DIAGNOSIS — Z122 Encounter for screening for malignant neoplasm of respiratory organs: Secondary | ICD-10-CM

## 2019-11-28 DIAGNOSIS — F17210 Nicotine dependence, cigarettes, uncomplicated: Secondary | ICD-10-CM

## 2019-11-29 ENCOUNTER — Other Ambulatory Visit: Payer: Self-pay | Admitting: Internal Medicine

## 2019-11-29 DIAGNOSIS — F17200 Nicotine dependence, unspecified, uncomplicated: Secondary | ICD-10-CM

## 2019-11-29 NOTE — Progress Notes (Signed)
Please call patient and let them  know their  low dose Ct was read as a Lung RADS 2: nodules that are benign in appearance and behavior with a very low likelihood of becoming a clinically active cancer due to size or lack of growth. Recommendation per radiology is for a repeat LDCT in 12 months. .Please let them  know we will order and schedule their  annual screening scan for 11/2020. Please let them  know there was notation of CAD on their  scan.  Please remind the patient  that this is a non-gated exam therefore degree or severity of disease  cannot be determined. Please have them  follow up with their PCP regarding potential risk factor modification, dietary therapy or pharmacologic therapy if clinically indicated. Pt.  is currently on statin therapy. Please place order for annual  screening scan for  01/2020 and fax results to PCP. Thanks so much.  This patient is on statins and has been followed by cards since 2019.

## 2019-11-30 ENCOUNTER — Other Ambulatory Visit: Payer: Self-pay | Admitting: *Deleted

## 2019-11-30 DIAGNOSIS — F17210 Nicotine dependence, cigarettes, uncomplicated: Secondary | ICD-10-CM

## 2019-11-30 DIAGNOSIS — Z87891 Personal history of nicotine dependence: Secondary | ICD-10-CM

## 2020-05-01 IMAGING — US US THYROID
1 series · 14 of 25 positions shown · non-contrast
Comparison: None.

CLINICAL DATA: Palpable abnormality.  Patient feels knot in neck.

EXAM:
THYROID ULTRASOUND
TECHNIQUE: Ultrasound examination of the thyroid gland and adjacent soft
tissues was performed.

[Series 1: us thyroid · 0.07mm/px · 14 of 40 slices shown]
[im 1/40]
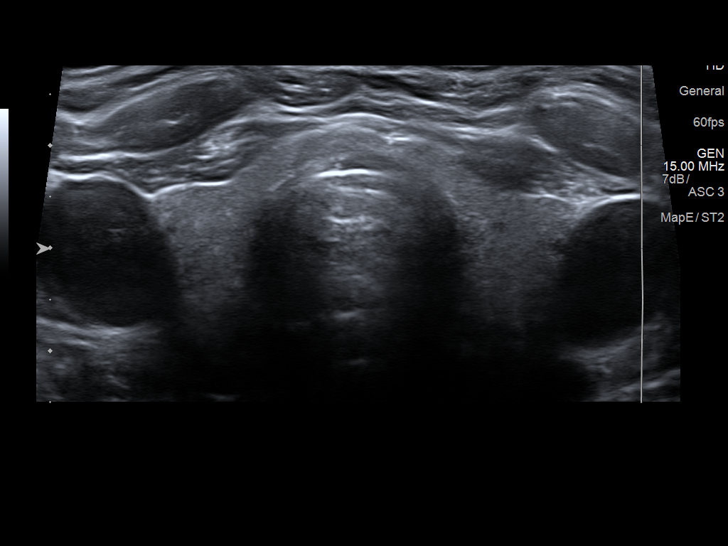
[im 4/40]
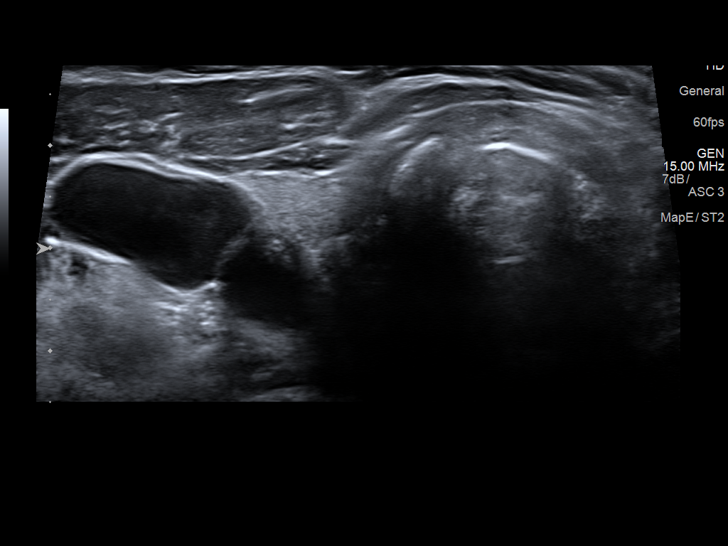
[im 7/40]
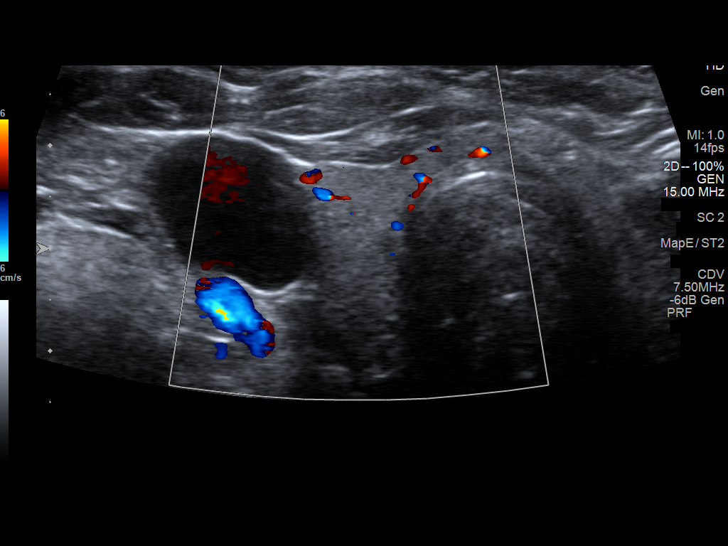
[im 10/40]
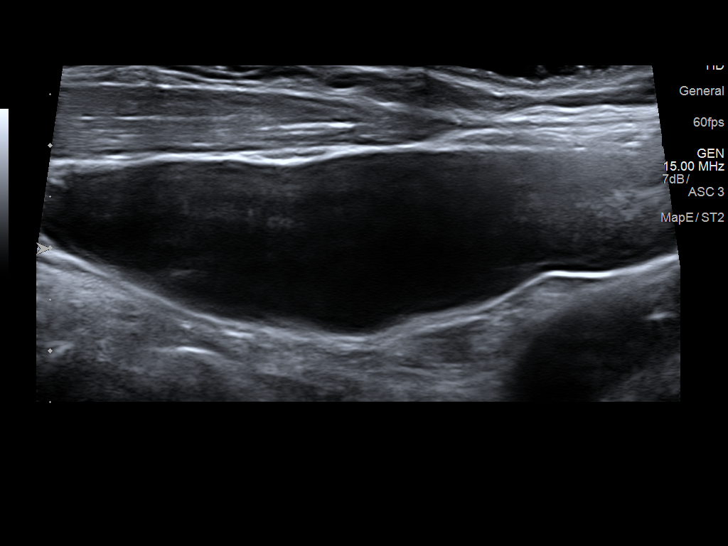
[im 14/40]
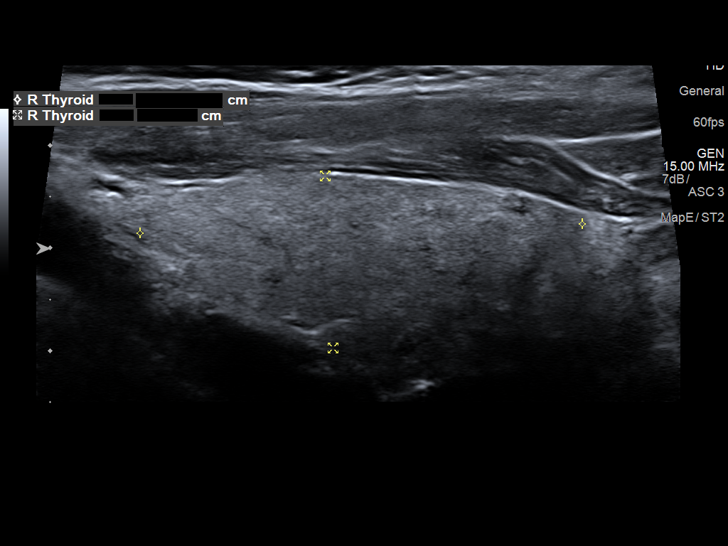
[im 15/40]
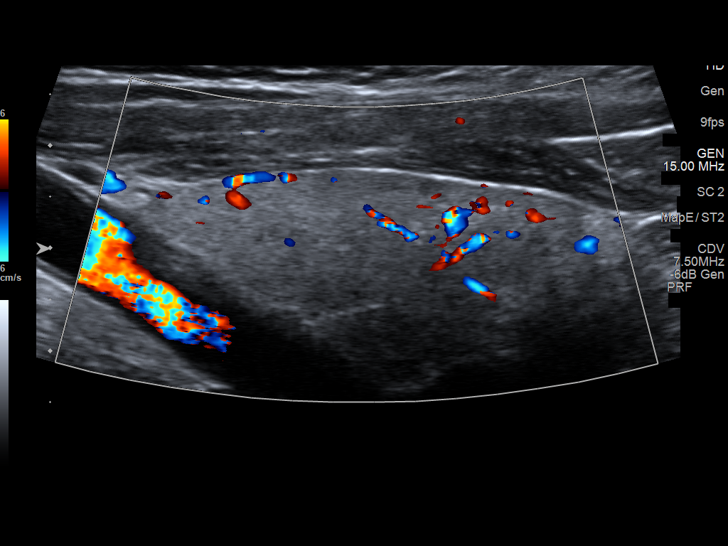
[im 18/40]
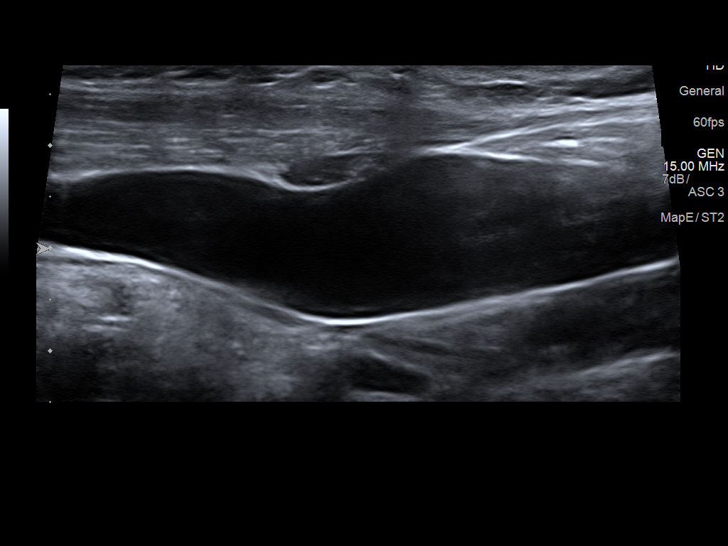
[im 22/40]
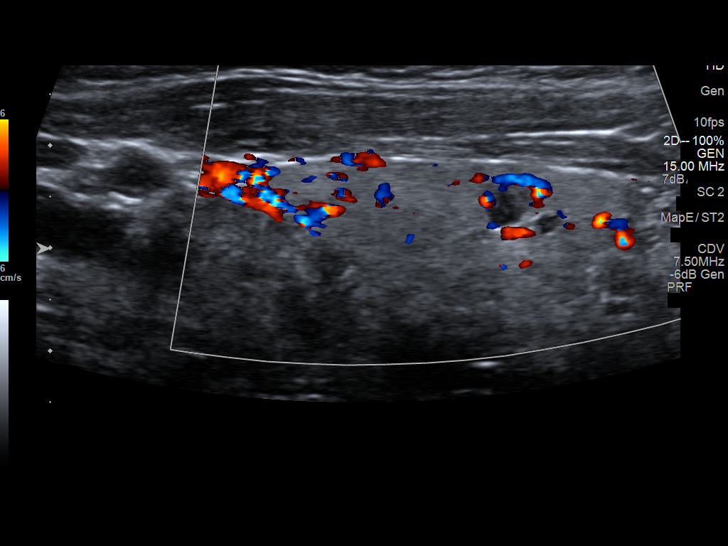
[im 25/40]
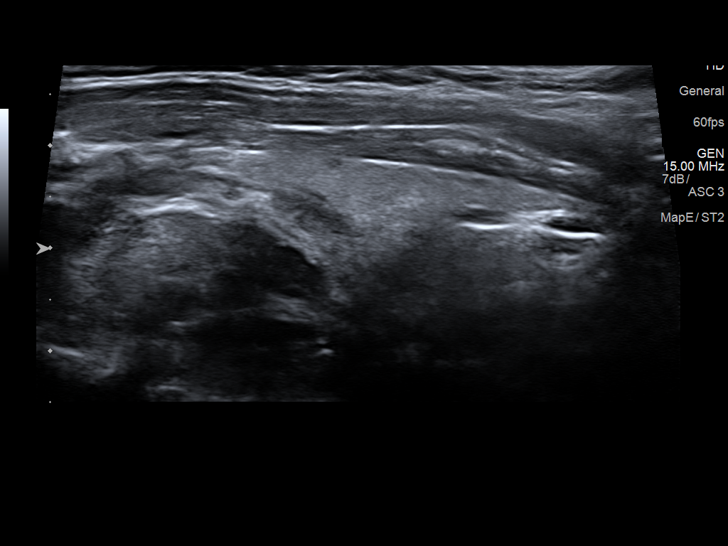
[im 27/40]
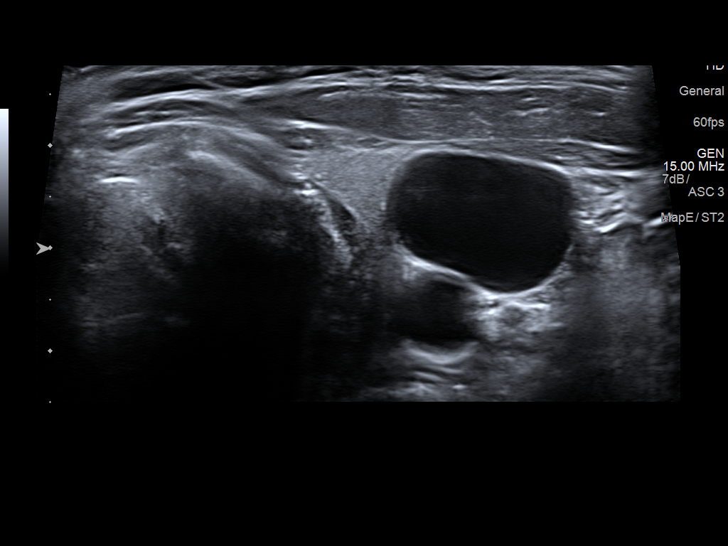
[im 30/40]
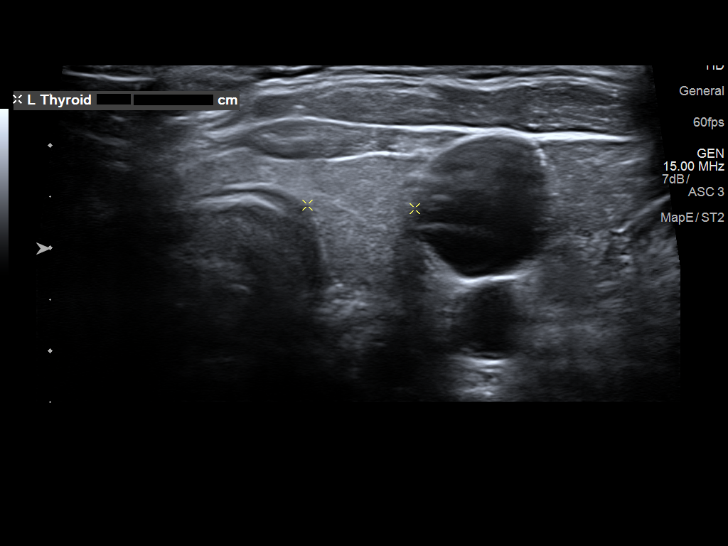
[im 33/40]
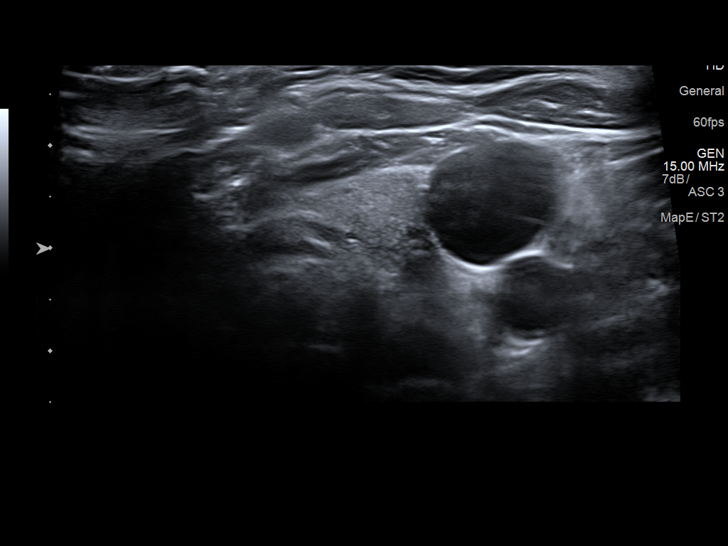
[im 36/40]
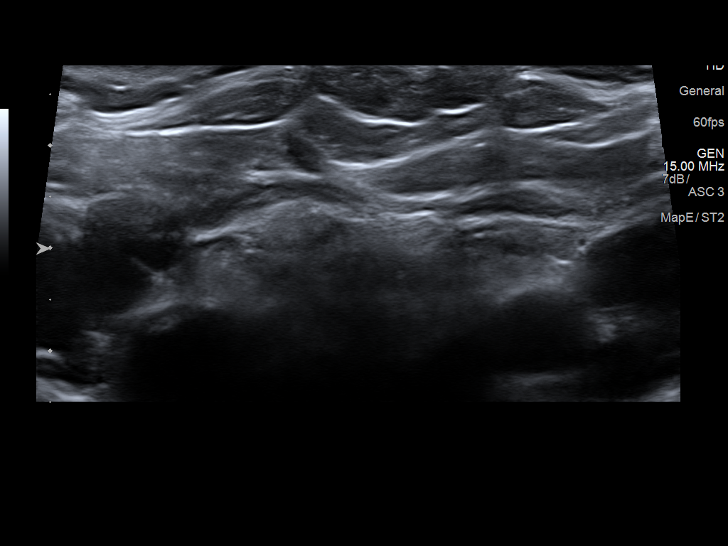
[im 40/40]
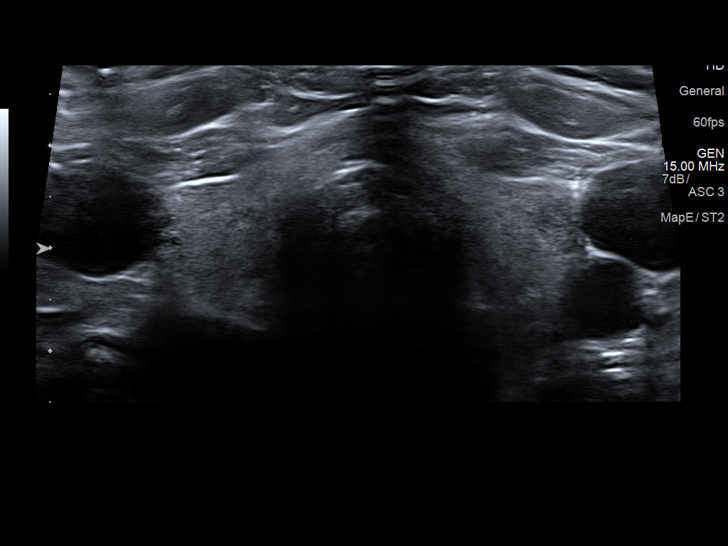

[14 of 25 positions shown; findings below may reference images not displayed]

FINDINGS: Parenchymal Echotexture: Normal

Isthmus: 0.4 cm

Right lobe: 4.3 x 1.7 x 1.1 cm

Left lobe: 4.0 x 1.2 x 1.1 cm

_________________________________________________________

Estimated total number of nodules >/= 1 cm: 0

Number of spongiform nodules >/=  2 cm not described below (TR1): 0

Number of mixed cystic and solid nodules >/= 1.5 cm not described
below (TR2): 0

_________________________________________________________

Small nodule in the left lobe measuring 0.5 cm does not meet
criteria for biopsy nor follow-up.
IMPRESSION: There are no suspicious nodules which meet criteria for biopsy nor
follow-up. The gland is within normal limits in size.

The above is in keeping with the ACR TI-RADS recommendations - [HOSPITAL] 1421;[DATE].

## 2020-10-27 ENCOUNTER — Other Ambulatory Visit: Payer: Self-pay | Admitting: Internal Medicine

## 2020-10-27 DIAGNOSIS — F17200 Nicotine dependence, unspecified, uncomplicated: Secondary | ICD-10-CM

## 2020-11-20 NOTE — Progress Notes (Signed)
Cardiology Office Note   Date:  11/21/2020   ID:  EDGAR CORRIGAN, DOB 10/04/1945, MRN [id number]  PCP:  Velna Hatchet, MD  Cardiologist:   Dorris Carnes, MD    PT presents for f/u of CP and CAD    History of Present Illness: Autumn Mckinney is a 75 y.o. female with a history of HTN, GERD,  chest pain, fatigue    Myovue 2012  was normal    Continued pain   Coronary artery CT scan was sugg of signif dz    Went on to have L heart cath   Ths showed only moderate narrowings    In Dec 2019  the pt was found to have a Hgb of 5   Colonoscopy showed tubular adenoma   EGD was neg   I last saw her as a televisiti in Nov 2020.  Since seen, the patient has done okay from a cardiac standpoint.  She says she gets chest discomfort but she also has a known hiatal hernia and thinks it is because of this.  Worse after meals.  She denies chest pain with after activity.  Breathing is okay.  She complains of some weakness in her legs feels unsteady at times.  She uses a cane.  At when I saw her last she had some cramping in her legs.  I recommended stopping Crestor as a trial.  This helped the symptoms they actually went away.  She has not restarted it  I saw the pt in NOv 2021  The pt had COVID in Jan   Fatigue   REcovered  Pt gets occasional CP   Anytime   Drinks water Helps some     No CP at other times  No dzziness    Breathing is good, steady   Current Meds  Medication Sig   amLODipine (NORVASC) 10 MG tablet Take 10 mg by mouth at bedtime.   Ascorbic Acid (VITAMIN C) 1000 MG tablet Take 1,000 mg by mouth daily.   atorvastatin (LIPITOR) 10 MG tablet Take 1 tablet (10 mg total) by mouth daily.   Calcium Carb-Cholecalciferol 600-400 MG-UNIT TABS Take 1 tablet by mouth daily.   Cholecalciferol 50 MCG (2000 UT) TABS Take 1 tablet by mouth daily.   levocetirizine (XYZAL) 5 MG tablet Take 5 mg by mouth at bedtime.   losartan (COZAAR) 25 MG tablet Take 50 mg by mouth daily.   nitroGLYCERIN  (NITROSTAT) 0.4 MG SL tablet Place 0.4 mg under the tongue every 5 (five) minutes as needed for chest pain.   omeprazole (PRILOSEC) 40 MG capsule Take 1 capsule (40 mg total) by mouth daily.   topiramate (TOPAMAX) 50 MG tablet Take 50 mg by mouth at bedtime.    zolpidem (AMBIEN) 10 MG tablet Take 10 mg by mouth at bedtime.     Allergies:   Hydrocodone-acetaminophen and Vicodin [hydrocodone-acetaminophen]   Past Medical History:  Diagnosis Date   Anemia    Angina at rest [facility])    Atherosclerosis    CHF (congestive heart failure) (HCC)    COPD (chronic obstructive pulmonary disease) (HCC)    Coronary artery calcification    Current every day smoker 11/15/2014   Counseled on quitting    Esophageal reflux    Gallstones    GERD (gastroesophageal reflux disease)    Herniated disc    History of chest pain    History of herniated intervertebral disc    Hot flashes    Hypercholesterolemia  Hypertension    Insomnia    Lung nodule    Osteopenia    Pancreatic cyst    PNA (pneumonia)    Tobacco use    Vaginal dryness     Past Surgical History:  Procedure Laterality Date   CHOLECYSTECTOMY     COLONOSCOPY N/A 12/17/2017   Procedure: COLONOSCOPY;  Surgeon: Irene Shipper, MD;  Location: [facility] ENDOSCOPY;  Service: Endoscopy;  Laterality: N/A;   ESOPHAGOGASTRODUODENOSCOPY N/A 12/17/2017   Procedure: ESOPHAGOGASTRODUODENOSCOPY (EGD);  Surgeon: Irene Shipper, MD;  Location: [facility] ENDOSCOPY;  Service: Endoscopy;  Laterality: N/A;   GIVENS CAPSULE STUDY N/A 12/19/2017   Procedure: GIVENS CAPSULE STUDY;  Surgeon: Carol Ada, MD;  Location: [place];  Service: Endoscopy;  Laterality: N/A;   LEFT HEART CATH AND CORONARY ANGIOGRAPHY N/A 02/25/2017   Procedure: LEFT HEART CATH AND CORONARY ANGIOGRAPHY;  Surgeon: Jettie Booze, MD;  Location: [place] CV LAB;  Service: Cardiovascular;  Laterality: N/A;   POLYPECTOMY  12/17/2017   Procedure: POLYPECTOMY;  Surgeon: Irene Shipper, MD;   Location: [facility] ENDOSCOPY;  Service: Endoscopy;;     Social History:  The patient  reports that she has been smoking cigarettes. She has a 11.00 pack-year smoking history. She has never used smokeless tobacco. She reports that she does not drink alcohol and does not use drugs.   Family History:  The patient's family history includes Arthritis in her sister; CVA in her brother and mother; Cancer in her sister; Diabetes in her mother and sister; Heart attack in her father; Lung cancer in her brother; Prostate cancer in her brother; Stroke in her mother.    ROS:  Please see the history of present illness. All other systems are reviewed and  Negative to the above problem except as noted.    PHYSICAL EXAM: VS:  BP (!) 142/62   Pulse 65   Ht 5' 1.5" (1.562 m)   Wt 131 lb 12.8 oz (59.8 kg)   SpO2 97%   BMI 24.50 kg/m   GEN: Well nourished, well developed, in no acute distress  HEENT: normal  Neck: JVP is not elevated   No bruits    Cardiac: RRR; no murmurs,,no LE edema  Respiratory:  clear to auscultation bilaterally  GI: soft, nontender, nondistended, + BS  No hepatomegaly  MS: no deformity Moving all extremities   Skin: warm and dry, no rash Neuro:  Strength and sensation are intact Psych: euthymic mood, full affect   EKG:  EKG is not  ordered today   Lipid Panel    Component Value Date/Time   CHOL 111 01/20/2018 0757   TRIG 91 01/20/2018 0757   HDL 41 01/20/2018 0757   CHOLHDL 2.7 01/20/2018 0757   LDLCALC 52 01/20/2018 0757      Wt Readings from Last 3 Encounters:  11/21/20 131 lb 12.8 oz (59.8 kg)  11/26/19 142 lb 6.4 oz (64.6 kg)  03/01/19 147 lb 6.4 oz (66.9 kg)    Cardiaac Studies    L heart cath (Feb 2019)   Mid Cx lesion is 25% stenosed. Mid LAD lesion is 50% stenosed. Ost LAD lesion is 25% stenosed. Mid RCA lesion is 50% stenosed. The left ventricular systolic function is normal. LV end diastolic pressure is normal. The left ventricular ejection fraction  is 55-65% by visual estimate. There is no aortic valve stenosis.   CAD does not appear severe angiographically.  By CT, the only significant FFR was in the PLA but there was  not significant stenosis noted.        FFR  Feb 2019FINDINGS: FFR CT FFR CT was normal in LM Proximal RCA and entire circumflex. Values abnormal .84 in mid to distal RCA and .78 in posterior lateral branch. Also   Abnormal in mid LAD.  88 and distal LAD .83   IMPRESSION: FFR CT suggests significant stenosis in PLB and likely mid to distal LAD Suggest f/u catheterization     Electronically Signed   By: Jenkins Rouge M.D.   On: 02/17/2017 10:24     Cardiac CT angiogram FINDINGS: Non-cardiac: See separate report from [facility] Radiology. No significant findings on limited lung and soft tissue windows.   Calcium Score: LM and three vessel calcium noted   Coronary Arteries: Right dominant with no anomalies   LM: 50-75% ostial LM calcific disease   LAD: 50-75% calcific ostial LAD stenosis, 50-75% mixed plaque in proximal and mid LAD   D1: Normal   D2: Normal   D3: Small normal   Circumflex: Less than 50% calcific plaque in mid vessel   OM1: Large branching vessel normal   RCA: Dominant 50% calcific plaque in proximal mid and distal RCA   PDA: Less than 50% calcific plaque   PLA: 50-75% mixed plaque   IMPRESSION: 1.  Normal aortic root 3.6 cm with aortic atherosclerosis   2.  Calcium score 845 which is 82 th percentile for age and sex   74. Concern for hemodynamically significant LM and ostial/mid LAD stenosis as well as PLB stenosis Study will be sent for FFR CT   Jenkins Rouge    ASSESSMENT AND PLAN:  1  CAD  Moderate CAD  . I am not convinced of active angina  Patient with intermitt pains   Not associated with activity   No progressive   May be GI    Follow  2  HL  On lipitor 40   LDL 70  HDL 47   3   HTN higher than it should be   I would increase Cozaar to 50 bid  Get labs from  [facility] office   4  Tobacco use. Still smoking 5 or 6 per day   Says she doesn't inhale    COunselled on cutting back     Plan for f/u in clinic in 1 year      Signed, Dorris Carnes, MD  11/21/2020 11:32 AM    [address] [place], [place]  [id number]   ADDENDUM: PT underwent coronary CT angiogram  Calcium scor 845 LM 50 to 75% ostial; LAD 50 -75% ostial   50 to 75% mid LCxLess than 50%  RCA:  50% prox  PLSA 50 to 75%  FFR showed LM normal  RCA prox normal RCA 0.84  PLSA 0.73 normal   LAD 0.88 and distal LAD 0.83  Based on findings would recomm L heart cath tod define anatomy  Risks / benefits described  Pt understands and agrees to proceed.  Dorris Carnes   Pt Phone: [phone]; Fax: [phone]

## 2020-11-21 ENCOUNTER — Ambulatory Visit (INDEPENDENT_AMBULATORY_CARE_PROVIDER_SITE_OTHER): Payer: Medicare Other | Admitting: Internal Medicine

## 2020-11-21 ENCOUNTER — Other Ambulatory Visit: Payer: Self-pay

## 2020-11-21 ENCOUNTER — Encounter: Payer: Self-pay | Admitting: Internal Medicine

## 2020-11-21 VITALS — BP 142/62 | HR 65 | Ht 61.5 in | Wt 131.8 lb

## 2020-11-21 DIAGNOSIS — I2510 Atherosclerotic heart disease of native coronary artery without angina pectoris: Secondary | ICD-10-CM | POA: Diagnosis not present

## 2020-11-21 MED ORDER — LOSARTAN POTASSIUM 50 MG PO TABS: 50.0000 mg | ORAL_TABLET | Freq: Two times a day (BID) | ORAL | 3 refills | Status: AC

## 2020-11-21 NOTE — Patient Instructions (Addendum)
Medication Instructions:   START TAKING LOSARTAN 50 MG TWICE A DAY    *If you need a refill on your cardiac medications before your next appointment, please call your pharmacy*   Lab Work: NONE ORDERED  TODAY    If you have labs (blood work) drawn today and your tests are completely normal, you will receive your results only by: [place] (if you have MyChart) OR A paper copy in the mail If you have any lab test that is abnormal or we need to change your treatment, we will call you to review the results.   Testing/Procedures: NONE ORDERED  TODAY    Follow-Up: At [facility], you and your health needs are our priority.  As part of our continuing mission to provide you with exceptional heart care, we have created designated Provider Care Teams.  These Care Teams include your primary Cardiologist (physician) and Advanced Practice Providers (APPs -  Physician Assistants and Nurse Practitioners) who all work together to provide you with the care you need, when you need it.  We recommend signing up for the patient portal called "MyChart".  Sign up information is provided on this After Visit Summary.  MyChart is used to connect with patients for Virtual Visits (Telemedicine).  Patients are able to view lab/test results, encounter notes, upcoming appointments, etc.  Non-urgent messages can be sent to your provider as well.   To learn more about what you can do with MyChart, go to [email or web address].    Your next appointment:   1 year(s)  The format for your next appointment:   In Person  Provider:   Dorris Carnes, MD     Other Instructions

## 2020-12-16 ENCOUNTER — Ambulatory Visit
Admission: RE | Admit: 2020-12-16 | Discharge: 2020-12-16 | Disposition: A | Discharge status: Home / Self Care | Payer: Medicare Other | Source: Ambulatory Visit | Attending: Internal Medicine | Admitting: Internal Medicine

## 2020-12-16 DIAGNOSIS — F17200 Nicotine dependence, unspecified, uncomplicated: Secondary | ICD-10-CM

## 2020-12-19 ENCOUNTER — Other Ambulatory Visit: Payer: Self-pay | Admitting: *Deleted

## 2020-12-19 DIAGNOSIS — F17210 Nicotine dependence, cigarettes, uncomplicated: Secondary | ICD-10-CM

## 2020-12-19 DIAGNOSIS — Z87891 Personal history of nicotine dependence: Secondary | ICD-10-CM

## 2021-12-17 ENCOUNTER — Ambulatory Visit
Admission: RE | Admit: 2021-12-17 | Discharge: 2021-12-17 | Disposition: A | Discharge status: Home / Self Care | Payer: Medicare Other | Source: Ambulatory Visit | Attending: Internal Medicine | Admitting: Internal Medicine

## 2021-12-17 DIAGNOSIS — F17210 Nicotine dependence, cigarettes, uncomplicated: Secondary | ICD-10-CM

## 2021-12-17 DIAGNOSIS — Z87891 Personal history of nicotine dependence: Secondary | ICD-10-CM

## 2021-12-20 NOTE — Progress Notes (Signed)
Cardiology Office Note   Date:  12/21/2021   ID:  [name], DOB 11/29/1945, MRN [id number]  PCP:  Autumn Hatchet, MD  Cardiologist:   Dorris Carnes, MD    PT presents for f/u of CP and CAD    History of Present Illness: Autumn Mckinney is a 76 y.o. female with a history of HTN, GERD,  chest pain, fatigue    Myovue 2012  was normal    Continued pain   In 2019 Coronary artery CT scan was sugg of signif dz    Went on to have L heart cath   Ths showed only moderate narrowings    In Dec 2019  the pt was found to have a Hgb of 5   Colonoscopy showed tubular adenoma   EGD was neg   I saw the pt in cinic in Nov 2022  Pt has some SOB   Some coughing   Occasionally productive   Robitussin doesn't  1/2ppd  Occasional chest tightness     Ques with GI  Has hiatal hernia   If burps will improve Current Meds  Medication Sig   amLODipine (NORVASC) 10 MG tablet Take 10 mg by mouth at bedtime.   Cholecalciferol 50 MCG (2000 UT) TABS Take 1 tablet by mouth daily.   losartan (COZAAR) 50 MG tablet Take 1 tablet (50 mg total) by mouth in the morning and at bedtime.   nitroGLYCERIN (NITROSTAT) 0.4 MG SL tablet Place 0.4 mg under the tongue every 5 (five) minutes as needed for chest pain.   omeprazole (PRILOSEC) 40 MG capsule Take 1 capsule (40 mg total) by mouth daily.   rosuvastatin (CRESTOR) 5 MG tablet Take 5 mg by mouth at bedtime.   topiramate (TOPAMAX) 50 MG tablet Take 50 mg by mouth at bedtime.    zolpidem (AMBIEN) 10 MG tablet Take 10 mg by mouth at bedtime.   [DISCONTINUED] Ascorbic Acid (VITAMIN C) 1000 MG tablet Take 1,000 mg by mouth daily.   [DISCONTINUED] atorvastatin (LIPITOR) 10 MG tablet Take 1 tablet (10 mg total) by mouth daily.   [DISCONTINUED] Calcium Carb-Cholecalciferol 600-400 MG-UNIT TABS Take 1 tablet by mouth daily.   [DISCONTINUED] levocetirizine (XYZAL) 5 MG tablet Take 5 mg by mouth at bedtime.     Allergies:   Hydrocodone-acetaminophen and Vicodin  [hydrocodone-acetaminophen]   Past Medical History:  Diagnosis Date   Anemia    Angina at rest    Atherosclerosis    CHF (congestive heart failure) (HCC)    COPD (chronic obstructive pulmonary disease) ([place])    Coronary artery calcification    Current every day smoker 11/15/2014   Counseled on quitting    Esophageal reflux    Gallstones    GERD (gastroesophageal reflux disease)    Herniated disc    History of chest pain    History of herniated intervertebral disc    Hot flashes    Hypercholesterolemia    Hypertension    Insomnia    Lung nodule    Osteopenia    Pancreatic cyst    PNA (pneumonia)    Tobacco use    Vaginal dryness     Past Surgical History:  Procedure Laterality Date   CHOLECYSTECTOMY     COLONOSCOPY N/A 12/17/2017   Procedure: COLONOSCOPY;  Surgeon: Irene Shipper, MD;  Location[facility] ENDOSCOPY;  Service: Endoscopy;  Laterality: N/A;   ESOPHAGOGASTRODUODENOSCOPY N/A 12/17/2017   Procedure: ESOPHAGOGASTRODUODENOSCOPY (EGD);  Surgeon: Irene Shipper, MD;  Location: [facility]  ENDOSCOPY;  Service: Endoscopy;  Laterality: N/A;   GIVENS CAPSULE STUDY N/A 12/19/2017   Procedure: GIVENS CAPSULE STUDY;  Surgeon: Carol Ada, MD;  Location: [facility];  Service: Endoscopy;  Laterality: N/A;   LEFT HEART CATH AND CORONARY ANGIOGRAPHY N/A 02/25/2017   Procedure: LEFT HEART CATH AND CORONARY ANGIOGRAPHY;  Surgeon: Jettie Booze, MD;  Location: [place] CV LAB;  Service: Cardiovascular;  Laterality: N/A;   POLYPECTOMY  12/17/2017   Procedure: POLYPECTOMY;  Surgeon: Irene Shipper, MD;  Location: [facility] ENDOSCOPY;  Service: Endoscopy;;     Social History:  The patient  reports that she has been smoking cigarettes. She has a 11.00 pack-year smoking history. She has never used smokeless tobacco. She reports that she does not drink alcohol and does not use drugs.   Family History:  The patient's family history includes Arthritis in her sister; CVA in her brother and mother;  Cancer in her sister; Diabetes in her mother and sister; Heart attack in her father; Lung cancer in her brother; Prostate cancer in her brother; Stroke in her mother.    ROS:  Please see the history of present illness. All other systems are reviewed and  Negative to the above problem except as noted.    PHYSICAL EXAM: VS:  BP 132/74   Pulse 70   Ht 5' 1.5" (1.562 m)   Wt 135 lb 6.4 oz (61.4 kg)   SpO2 99%   BMI 25.17 kg/m   GEN: Well nourished, well developed, in no acute distress  HEENT: normal  Neck: JVP is not elevated   No bruits    Cardiac: RRR; no murmurs  No  LE edema  Respiratory:  Moving air   MIld rhonchi   no wheezes   GI: soft, nontender, nondistended, + BS  No hepatomegaly  MS: no deformity Moving all extremities   Skin: warm and dry, no rash Neuro:  Strength and sensation are intact Psych: euthymic mood, full affect   EKG:  EKG SR 67      Lipid Panel    Component Value Date/Time   CHOL 111 01/20/2018 0757   TRIG 91 01/20/2018 0757   HDL 41 01/20/2018 0757   CHOLHDL 2.7 01/20/2018 0757   LDLCALC 52 01/20/2018 0757      Wt Readings from Last 3 Encounters:  12/21/21 135 lb 6.4 oz (61.4 kg)  11/21/20 131 lb 12.8 oz (59.8 kg)  11/26/19 142 lb 6.4 oz (64.6 kg)    Cardiaac Studies    L heart cath (Feb 2019)   Mid Cx lesion is 25% stenosed. Mid LAD lesion is 50% stenosed. Ost LAD lesion is 25% stenosed. Mid RCA lesion is 50% stenosed. The left ventricular systolic function is normal. LV end diastolic pressure is normal. The left ventricular ejection fraction is 55-65% by visual estimate. There is no aortic valve stenosis.   CAD does not appear severe angiographically.  By CT, the only significant FFR was in the PLA but there was not significant stenosis noted.        FFR  Feb 2019FINDINGS: FFR CT FFR CT was normal in LM Proximal RCA and entire circumflex. Values abnormal .84 in mid to distal RCA and .78 in posterior lateral branch. Also    Abnormal in mid LAD.  88 and distal LAD .83   IMPRESSION: FFR CT suggests significant stenosis in PLB and likely mid to distal LAD Suggest f/u catheterization     Electronically Signed   By: Collier Salina  Johnsie Cancel M.D.   On: 02/17/2017 10:24     Cardiac CT angiogram FINDINGS: Non-cardiac: See separate report from [facility] Radiology. No significant findings on limited lung and soft tissue windows.   Calcium Score: LM and three vessel calcium noted   Coronary Arteries: Right dominant with no anomalies   LM: 50-75% ostial LM calcific disease   LAD: 50-75% calcific ostial LAD stenosis, 50-75% mixed plaque in proximal and mid LAD   D1: Normal   D2: Normal   D3: Small normal   Circumflex: Less than 50% calcific plaque in mid vessel   OM1: Large branching vessel normal   RCA: Dominant 50% calcific plaque in proximal mid and distal RCA   PDA: Less than 50% calcific plaque   PLA: 50-75% mixed plaque   IMPRESSION: 1.  Normal aortic root 3.6 cm with aortic atherosclerosis   2.  Calcium score 845 which is 12 th percentile for age and sex   43. Concern for hemodynamically significant LM and ostial/mid LAD stenosis as well as PLB stenosis Study will be sent for FFR CT   Jenkins Rouge    ASSESSMENT AND PLAN:  1  CAD  Pt with CT angiogram  and cath in 2019  No flow limiting dz    I am not convinced of active angina/ischemia   Occasinoal pressure sounds more GI (has hiatal hernia) Continue  to risk factor modify     2  HL  On Crestor 5 mg   LDL 58  HDL 41  Trig 71     3   HTN BP controlled on current regimen      4  Tobacco use. Counselled on cessation     5  Hgb A1c   5.9  Limit carbs   less snacking   Follow up in 9 months

## 2021-12-21 ENCOUNTER — Ambulatory Visit: Payer: Medicare Other | Attending: Internal Medicine | Admitting: Internal Medicine

## 2021-12-21 ENCOUNTER — Encounter: Payer: Self-pay | Admitting: Internal Medicine

## 2021-12-21 ENCOUNTER — Other Ambulatory Visit: Payer: Self-pay

## 2021-12-21 VITALS — BP 132/74 | HR 70 | Ht 61.5 in | Wt 135.4 lb

## 2021-12-21 DIAGNOSIS — I2510 Atherosclerotic heart disease of native coronary artery without angina pectoris: Secondary | ICD-10-CM | POA: Diagnosis not present

## 2021-12-21 DIAGNOSIS — F17210 Nicotine dependence, cigarettes, uncomplicated: Secondary | ICD-10-CM

## 2021-12-21 DIAGNOSIS — R911 Solitary pulmonary nodule: Secondary | ICD-10-CM

## 2021-12-21 DIAGNOSIS — Z87891 Personal history of nicotine dependence: Secondary | ICD-10-CM

## 2021-12-21 MED ORDER — NITROGLYCERIN 0.4 MG SL SUBL: 0.4000 mg | SUBLINGUAL_TABLET | SUBLINGUAL | 3 refills | Status: AC | PRN

## 2021-12-21 NOTE — Patient Instructions (Signed)
Medication Instructions:   *If you need a refill on your cardiac medications before your next appointment, please call your pharmacy*   Lab Work:  If you have labs (blood work) drawn today and your tests are completely normal, you will receive your results only by: [place] (if you have MyChart) OR A paper copy in the mail If you have any lab test that is abnormal or we need to change your treatment, we will call you to review the results.   Testing/Procedures:    Follow-Up: At [facility], you and your health needs are our priority.  As part of our continuing mission to provide you with exceptional heart care, we have created designated Provider Care Teams.  These Care Teams include your primary Cardiologist (physician) and Advanced Practice Providers (APPs -  Physician Assistants and Nurse Practitioners) who all work together to provide you with the care you need, when you need it.  We recommend signing up for the patient portal called "MyChart".  Sign up information is provided on this After Visit Summary.  MyChart is used to connect with patients for Virtual Visits (Telemedicine).  Patients are able to view lab/test results, encounter notes, upcoming appointments, etc.  Non-urgent messages can be sent to your provider as well.   To learn more about what you can do with MyChart, go to [email or web address].    Your next appointment:   9 month(s)  The format for your next appointment:   In Person  Provider:   Dorris Carnes, MD     Other Instructions   Important Information About Sugar

## 2022-06-04 ENCOUNTER — Other Ambulatory Visit: Payer: Self-pay | Admitting: Internal Medicine

## 2022-06-04 DIAGNOSIS — R911 Solitary pulmonary nodule: Secondary | ICD-10-CM

## 2023-02-02 ENCOUNTER — Other Ambulatory Visit: Payer: Self-pay | Admitting: Internal Medicine

## 2023-02-02 DIAGNOSIS — R911 Solitary pulmonary nodule: Secondary | ICD-10-CM

## 2023-02-16 ENCOUNTER — Ambulatory Visit
Admission: RE | Admit: 2023-02-16 | Discharge: 2023-02-16 | Disposition: A | Discharge status: Home / Self Care | Payer: 59 | Source: Ambulatory Visit | Attending: Internal Medicine | Admitting: Internal Medicine

## 2023-02-16 DIAGNOSIS — R911 Solitary pulmonary nodule: Secondary | ICD-10-CM

## 2023-03-17 ENCOUNTER — Other Ambulatory Visit: Payer: Self-pay | Admitting: Internal Medicine

## 2023-03-17 DIAGNOSIS — Q670 Congenital facial asymmetry: Secondary | ICD-10-CM

## 2023-03-17 DIAGNOSIS — R200 Anesthesia of skin: Secondary | ICD-10-CM

## 2023-03-17 DIAGNOSIS — R4789 Other speech disturbances: Secondary | ICD-10-CM

## 2023-04-02 ENCOUNTER — Ambulatory Visit
Admission: RE | Admit: 2023-04-02 | Discharge: 2023-04-02 | Disposition: A | Discharge status: Home / Self Care | Source: Ambulatory Visit | Attending: Internal Medicine | Admitting: Internal Medicine

## 2023-04-02 DIAGNOSIS — R4789 Other speech disturbances: Secondary | ICD-10-CM

## 2023-04-02 DIAGNOSIS — R200 Anesthesia of skin: Secondary | ICD-10-CM

## 2023-04-02 DIAGNOSIS — Q670 Congenital facial asymmetry: Secondary | ICD-10-CM
